# Patient Record
Sex: Male | Born: 1970 | Race: White | Hispanic: No | Marital: Single | Smoking: Current every day smoker
Health system: Southern US, Community
[De-identification: ages and names within clinical notes are randomized; demographics above are authoritative.]

## PROBLEM LIST (undated history)

## (undated) DIAGNOSIS — S2239XA Fracture of one rib, unspecified side, initial encounter for closed fracture: Secondary | ICD-10-CM

## (undated) DIAGNOSIS — S62102A Fracture of unspecified carpal bone, left wrist, initial encounter for closed fracture: Secondary | ICD-10-CM

## (undated) DIAGNOSIS — K219 Gastro-esophageal reflux disease without esophagitis: Secondary | ICD-10-CM

## (undated) HISTORY — PX: TONSILLECTOMY: SUR1361

---

## 2018-11-28 ENCOUNTER — Emergency Department
Admission: EM | Admit: 2018-11-28 | Discharge: 2018-11-28 | Disposition: A | Discharge status: Home / Self Care | Payer: No Typology Code available for payment source | Attending: Emergency Medicine | Admitting: Emergency Medicine

## 2018-11-28 ENCOUNTER — Encounter: Payer: Self-pay | Admitting: Emergency Medicine

## 2018-11-28 ENCOUNTER — Other Ambulatory Visit: Payer: Self-pay

## 2018-11-28 DIAGNOSIS — H109 Unspecified conjunctivitis: Secondary | ICD-10-CM | POA: Diagnosis not present

## 2018-11-28 DIAGNOSIS — H1033 Unspecified acute conjunctivitis, bilateral: Secondary | ICD-10-CM

## 2018-11-28 DIAGNOSIS — H10022 Other mucopurulent conjunctivitis, left eye: Secondary | ICD-10-CM | POA: Diagnosis present

## 2018-11-28 MED ORDER — POLYMYXIN B-TRIMETHOPRIM 10000-0.1 UNIT/ML-% OP SOLN: 2.0000 [drp] | Freq: Four times a day (QID) | OPHTHALMIC | 0 refills | Status: AC

## 2018-11-28 MED ORDER — POLYMYXIN B-TRIMETHOPRIM 10000-0.1 UNIT/ML-% OP SOLN
2.0000 [drp] | Freq: Four times a day (QID) | OPHTHALMIC | Status: DC
  Administered 2018-11-28: 2 [drp] via OPHTHALMIC
  Filled 2018-11-28: qty 10

## 2018-11-28 NOTE — ED Provider Notes (Signed)
[facility] Emergency Department Provider Note  ____________________________________________  Time seen: Approximately 8:55 PM  I have reviewed the triage vital signs and the nursing notes.   HISTORY  Chief Complaint Eye Problem    HPI Maurice Mullen is a 48 y.o. male who presents the emergency department complaining of right eye irritation, drainage that is now starting to involve the left.  Patient presents stating that he believes he has pinkeye.  Symptoms have been ongoing times approximately 5 days.  Patient is having a scratchy sensation to the right eye at this time.  Patient reports that he believes he got it while swimming a week ago.  No other complaints at this time.  No vision changes.  No trauma to the eye.         History reviewed. No pertinent past medical history.  There are no active problems to display for this patient.   History reviewed. No pertinent surgical history.  Prior to Admission medications   Medication Sig Start Date End Date Taking? Authorizing Provider  trimethoprim-polymyxin b (POLYTRIM) ophthalmic solution Place 2 drops into both eyes every 6 (six) hours. 11/28/18   Cuthriell, Delorise RoyalsJonathan D, PA-C    Allergies Patient has no known allergies.  No family history on file.  Social History Social History   Tobacco Use  . Smoking status: Not on file  Substance Use Topics  . Alcohol use: Not on file  . Drug use: Not on file     Review of Systems  Constitutional: No fever/chills Eyes: No visual changes.  Positive for drainage, erythema to the right eye now involving the left ENT: No upper respiratory complaints. Cardiovascular: no chest pain. Respiratory: no cough. No SOB. Gastrointestinal: No abdominal pain.  No nausea, no vomiting.   Musculoskeletal: Negative for musculoskeletal pain. Skin: Negative for rash, abrasions, lacerations, ecchymosis. Neurological: Negative for headaches, focal weakness or  numbness. 10-point ROS otherwise negative.  ____________________________________________   PHYSICAL EXAM:  VITAL SIGNS: ED Triage Vitals  Enc Vitals Group     BP 11/28/18 1836 115/80     Pulse Rate 11/28/18 1836 81     Resp 11/28/18 1836 20     Temp 11/28/18 1836 98.8 F (37.1 C)     Temp Source 11/28/18 1836 Oral     SpO2 11/28/18 1836 96 %     Weight 11/28/18 1837 220 lb (99.8 kg)     Height 11/28/18 1837 6' (1.829 m)     Head Circumference --      Peak Flow --      Pain Score 11/28/18 1837 8     Pain Loc --      Pain Edu? --      Excl. in GC? --      Constitutional: Alert and oriented. Well appearing and in no acute distress. Eyes: Conjunctivae are erythematous on right, less on the left, purulent drainage noted to the right lashes.Marland Kitchen. PERRL. EOMI. funduscopic exam reveals red reflex bilaterally.  Vasculature and optic disc is unremarkable bilaterally. Head: Atraumatic. ENT:      Ears:       Nose: No congestion/rhinnorhea.      Mouth/Throat: Mucous membranes are moist.  Neck: No stridor.    Cardiovascular: Normal rate, regular rhythm. Normal S1 and S2.  Good peripheral circulation. Respiratory: Normal respiratory effort without tachypnea or retractions. Lungs CTAB. Good air entry to the bases with no decreased or absent breath sounds. Musculoskeletal: Full range of motion to all extremities. No gross  deformities appreciated. Neurologic:  Normal speech and language. No gross focal neurologic deficits are appreciated.  Skin:  Skin is warm, dry and intact. No rash noted. Psychiatric: Mood and affect are normal. Speech and behavior are normal. Patient exhibits appropriate insight and judgement.   ____________________________________________   LABS (all labs ordered are listed, but only abnormal results are displayed)  Labs Reviewed - No data to  display ____________________________________________  EKG   ____________________________________________  RADIOLOGY   No results found.  ____________________________________________    PROCEDURES  Procedure(s) performed:    Procedures    Medications  trimethoprim-polymyxin b (POLYTRIM) ophthalmic solution 2 drop (has no administration in time range)     ____________________________________________   INITIAL IMPRESSION / ASSESSMENT AND PLAN / ED COURSE  Pertinent labs & imaging results that were available during my care of the patient were reviewed by me and considered in my medical decision making (see chart for details).  Review of the [place] CSRS was performed in accordance of the [place] prior to dispensing any controlled drugs.           Patient's diagnosis is consistent with pinkeye.  Patient presented to emergency department complaining of right eye irritation and drainage, now involving the left.  Findings are consistent with bacterial conjunctivitis.  Patient is started on Polytrim in the emergency department and will be discharged with same.  Follow-up primary care as needed..  Patient is given ED precautions to return to the ED for any worsening or new symptoms.     ____________________________________________  FINAL CLINICAL IMPRESSION(S) / ED DIAGNOSES  Final diagnoses:  Acute bacterial conjunctivitis of both eyes      NEW MEDICATIONS STARTED DURING THIS VISIT:  ED Discharge Orders         Ordered    trimethoprim-polymyxin b (POLYTRIM) ophthalmic solution  Every 6 hours     11/28/18 2059              This chart was dictated using voice recognition software/Dragon. Despite best efforts to proofread, errors can occur which can change the meaning. Any change was purely unintentional.    Darletta Moll, PA-C 11/28/18 2059    Vanessa Manati, MD 11/29/18 1326

## 2018-11-28 NOTE — ED Triage Notes (Signed)
R eye irritation x 5 days. Sclera reddened, eye watered.

## 2020-06-05 ENCOUNTER — Ambulatory Visit (INDEPENDENT_AMBULATORY_CARE_PROVIDER_SITE_OTHER): Payer: No Typology Code available for payment source | Admitting: Psychology

## 2020-06-05 DIAGNOSIS — F3289 Other specified depressive episodes: Secondary | ICD-10-CM

## 2020-06-19 ENCOUNTER — Ambulatory Visit (INDEPENDENT_AMBULATORY_CARE_PROVIDER_SITE_OTHER): Payer: No Typology Code available for payment source | Admitting: Psychology

## 2020-06-19 DIAGNOSIS — F3289 Other specified depressive episodes: Secondary | ICD-10-CM

## 2020-06-27 ENCOUNTER — Ambulatory Visit (INDEPENDENT_AMBULATORY_CARE_PROVIDER_SITE_OTHER): Payer: No Typology Code available for payment source | Admitting: Psychology

## 2020-06-27 DIAGNOSIS — F3289 Other specified depressive episodes: Secondary | ICD-10-CM | POA: Diagnosis not present

## 2020-07-03 ENCOUNTER — Ambulatory Visit (INDEPENDENT_AMBULATORY_CARE_PROVIDER_SITE_OTHER): Payer: No Typology Code available for payment source | Admitting: Psychology

## 2020-07-03 DIAGNOSIS — F3289 Other specified depressive episodes: Secondary | ICD-10-CM | POA: Diagnosis not present

## 2020-07-10 ENCOUNTER — Ambulatory Visit: Payer: Self-pay | Admitting: Psychology

## 2020-08-15 ENCOUNTER — Other Ambulatory Visit: Payer: Self-pay

## 2020-08-15 ENCOUNTER — Emergency Department (HOSPITAL_COMMUNITY)
Admission: EM | Admit: 2020-08-15 | Discharge: 2020-08-15 | Disposition: A | Discharge status: Home / Self Care | Payer: No Typology Code available for payment source | Attending: Emergency Medicine | Admitting: Emergency Medicine

## 2020-08-15 ENCOUNTER — Emergency Department (HOSPITAL_COMMUNITY): Payer: No Typology Code available for payment source

## 2020-08-15 DIAGNOSIS — S2242XA Multiple fractures of ribs, left side, initial encounter for closed fracture: Secondary | ICD-10-CM

## 2020-08-15 DIAGNOSIS — S52592A Other fractures of lower end of left radius, initial encounter for closed fracture: Secondary | ICD-10-CM

## 2020-08-15 DIAGNOSIS — S0990XA Unspecified injury of head, initial encounter: Secondary | ICD-10-CM

## 2020-08-15 DIAGNOSIS — W19XXXA Unspecified fall, initial encounter: Secondary | ICD-10-CM

## 2020-08-15 DIAGNOSIS — S0001XA Abrasion of scalp, initial encounter: Secondary | ICD-10-CM | POA: Insufficient documentation

## 2020-08-15 DIAGNOSIS — Z23 Encounter for immunization: Secondary | ICD-10-CM | POA: Insufficient documentation

## 2020-08-15 DIAGNOSIS — S6992XA Unspecified injury of left wrist, hand and finger(s), initial encounter: Secondary | ICD-10-CM | POA: Diagnosis present

## 2020-08-15 DIAGNOSIS — R0789 Other chest pain: Secondary | ICD-10-CM | POA: Insufficient documentation

## 2020-08-15 DIAGNOSIS — S52502A Unspecified fracture of the lower end of left radius, initial encounter for closed fracture: Secondary | ICD-10-CM | POA: Diagnosis not present

## 2020-08-15 DIAGNOSIS — W11XXXA Fall on and from ladder, initial encounter: Secondary | ICD-10-CM | POA: Insufficient documentation

## 2020-08-15 LAB — COMPREHENSIVE METABOLIC PANEL
ALT: 23 U/L (ref 0–44)
AST: 37 U/L (ref 15–41)
Albumin: 3.4 g/dL — ABNORMAL LOW (ref 3.5–5.0)
Alkaline Phosphatase: 49 U/L (ref 38–126)
Anion gap: 4 — ABNORMAL LOW (ref 5–15)
BUN: 11 mg/dL (ref 6–20)
CO2: 27 mmol/L (ref 22–32)
Calcium: 8.6 mg/dL — ABNORMAL LOW (ref 8.9–10.3)
Chloride: 106 mmol/L (ref 98–111)
Creatinine, Ser: 0.98 mg/dL (ref 0.61–1.24)
GFR, Estimated: >60 mL/min (ref >60)
Glucose, Bld: 122 mg/dL — ABNORMAL HIGH (ref 70–99)
Potassium: 5.1 mmol/L (ref 3.5–5.1)
Sodium: 137 mmol/L (ref 135–145)
Total Bilirubin: 1.2 mg/dL (ref 0.3–1.2)
Total Protein: 6.1 g/dL — ABNORMAL LOW (ref 6.5–8.1)

## 2020-08-15 LAB — CBC
HCT: 43.1 % (ref 39.0–52.0)
Hemoglobin: 14.2 g/dL (ref 13.0–17.0)
MCH: 30.9 pg (ref 26.0–34.0)
MCHC: 32.9 g/dL (ref 30.0–36.0)
MCV: 93.9 fL (ref 80.0–100.0)
Platelets: 272 10*3/uL (ref 150–400)
RBC: 4.59 MIL/uL (ref 4.22–5.81)
RDW: 11.9 % (ref 11.5–15.5)
WBC: 8.4 10*3/uL (ref 4.0–10.5)
nRBC: 0 % (ref 0.0–0.2)

## 2020-08-15 IMAGING — CR DG FOREARM 2V*L*
2 series · 2 of 2 positions shown · non-contrast
Comparison: None.

CLINICAL DATA: Pain after fall from 7 foot ladder

EXAM:
LEFT FOREARM - 2 VIEW

[forearm ap]
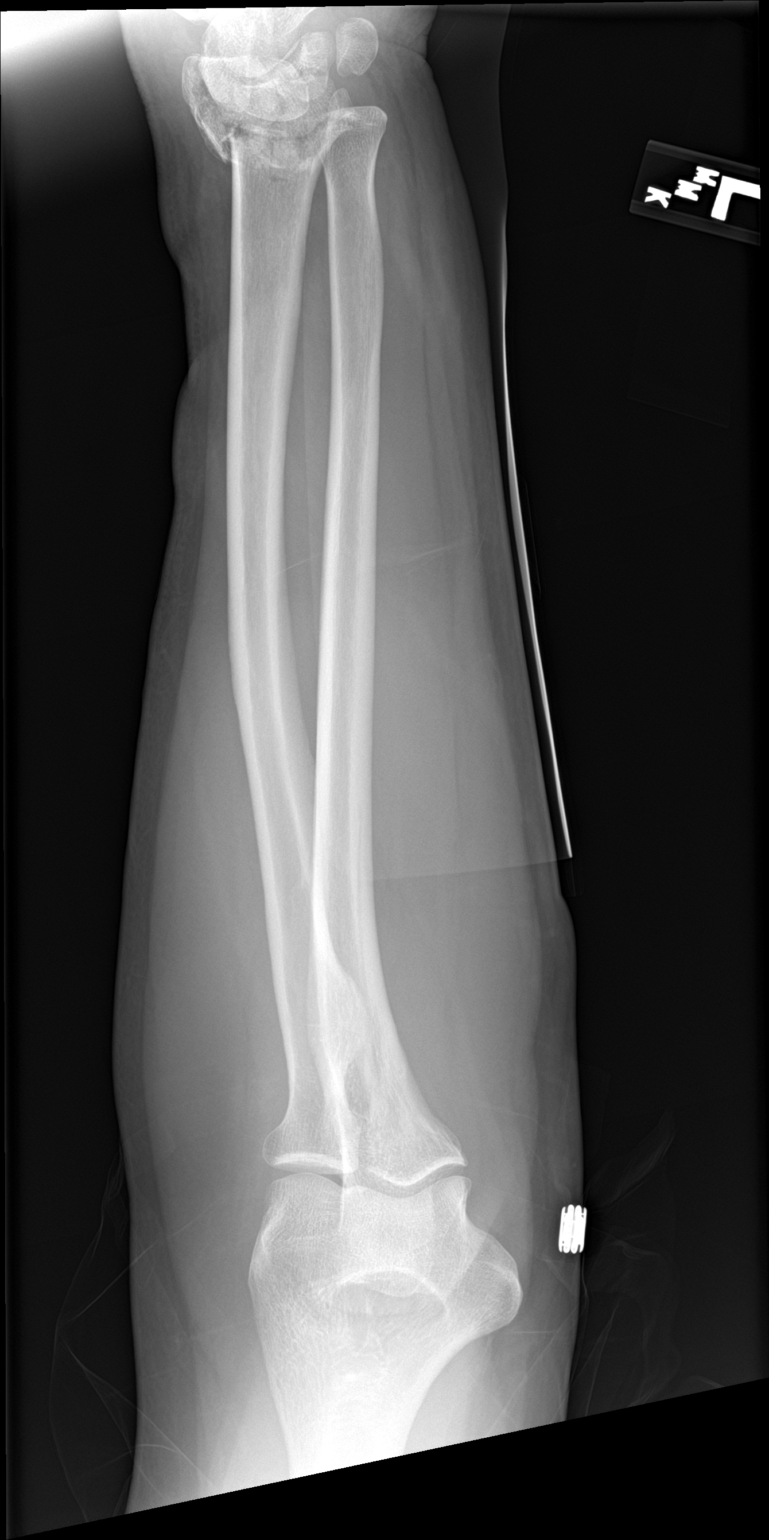

[forearm lat]
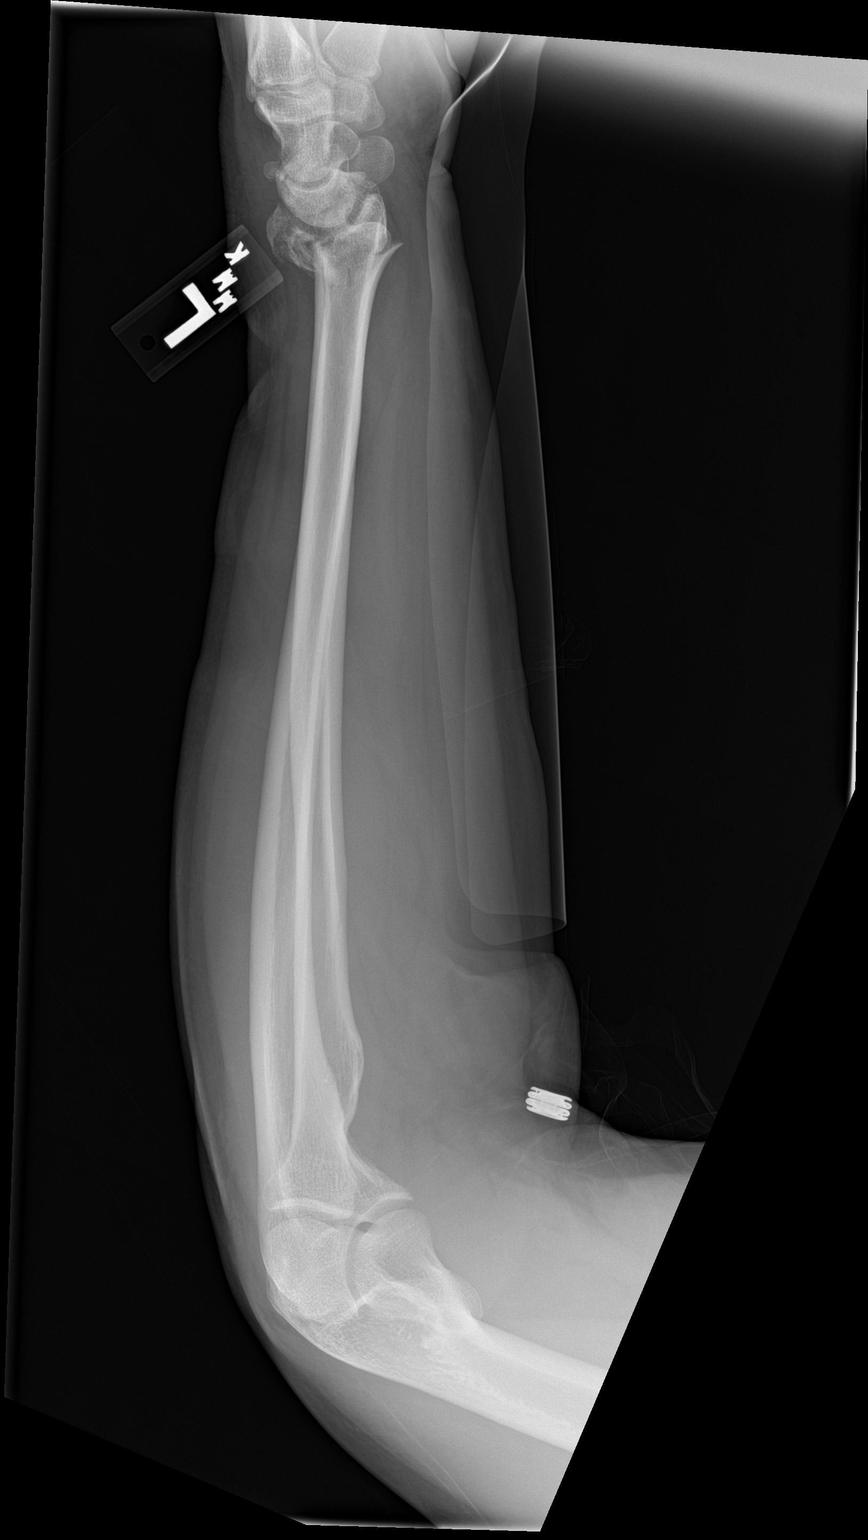

[2 of 2 positions shown; findings below may reference images not displayed]

FINDINGS: Comminuted fracture of the distal radius with dorsal angulation and
displacement of the dominant fracture fragment and extension into
the radiocarpal joint with disruption of the distal radioulnar
joint. The proximal radioulnar joint appear intact. No proximal
radial or ulnar fracture visualized. Soft tissues are unremarkable.
IMPRESSION: Comminuted fracture of the distal radius with dorsal angulation and
displacement of the dominant fracture fragment and extension into
the radiocarpal joint with disruption of the distal radioulnar
joint.

## 2020-08-15 IMAGING — CR DG RIBS W/ CHEST 3+V*L*
4 series · 4 of 4 positions shown · non-contrast
Comparison: None.

CLINICAL DATA: Pain after fall from 7 foot ladder

EXAM:
LEFT RIBS AND CHEST - 3+ VIEW

[chest pa]
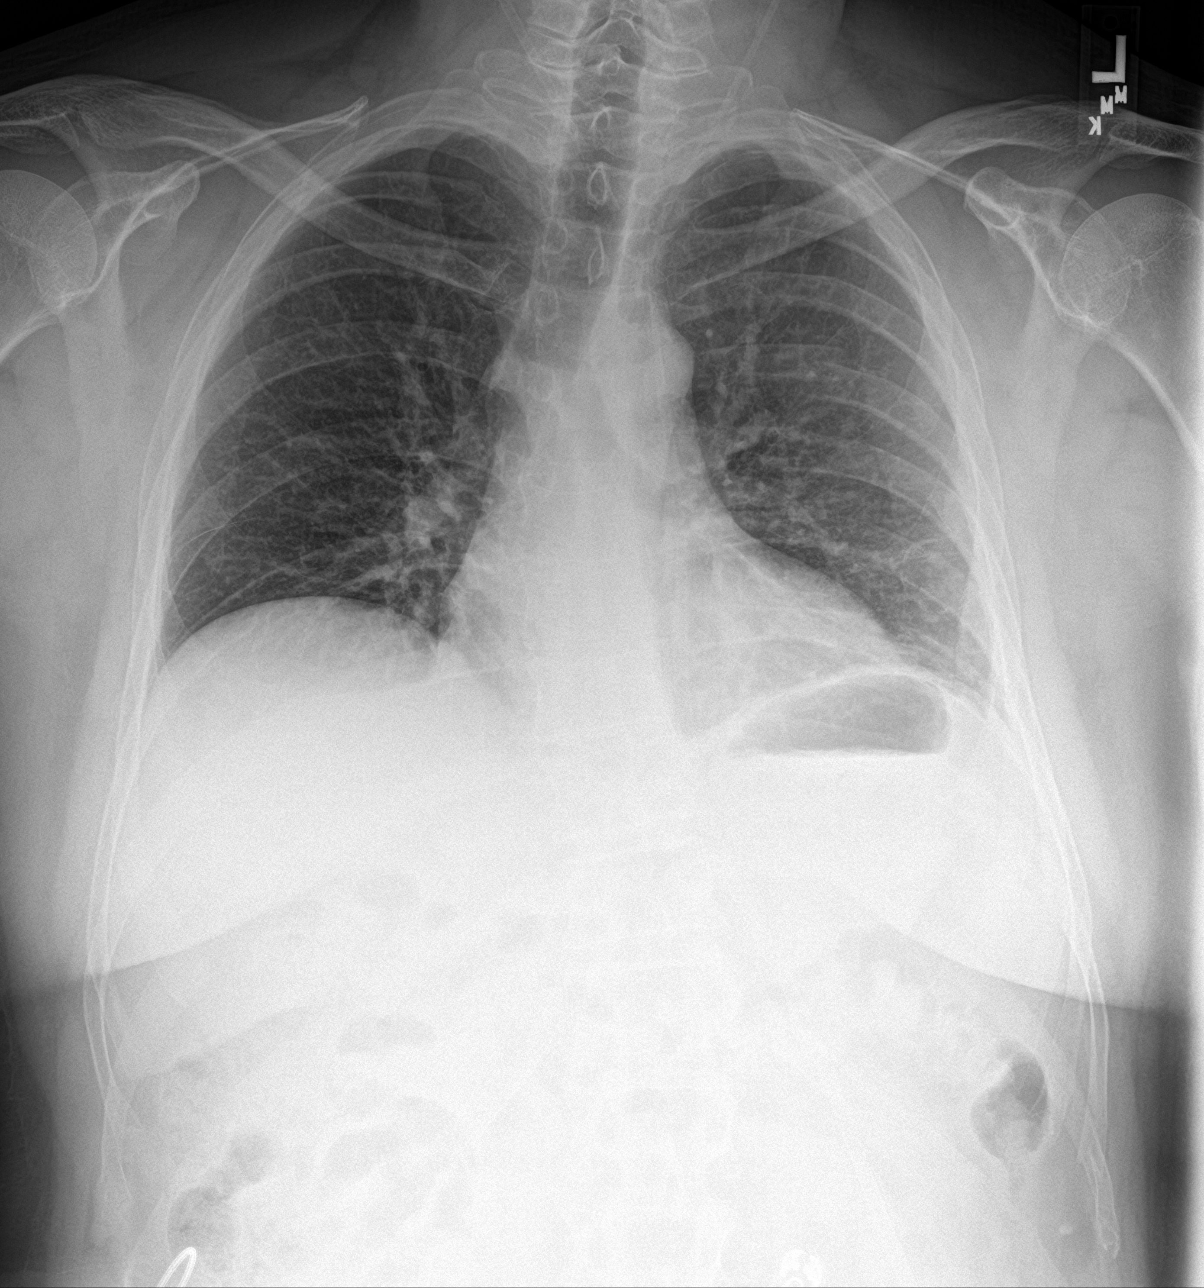

[rib ap (1 of 2)]
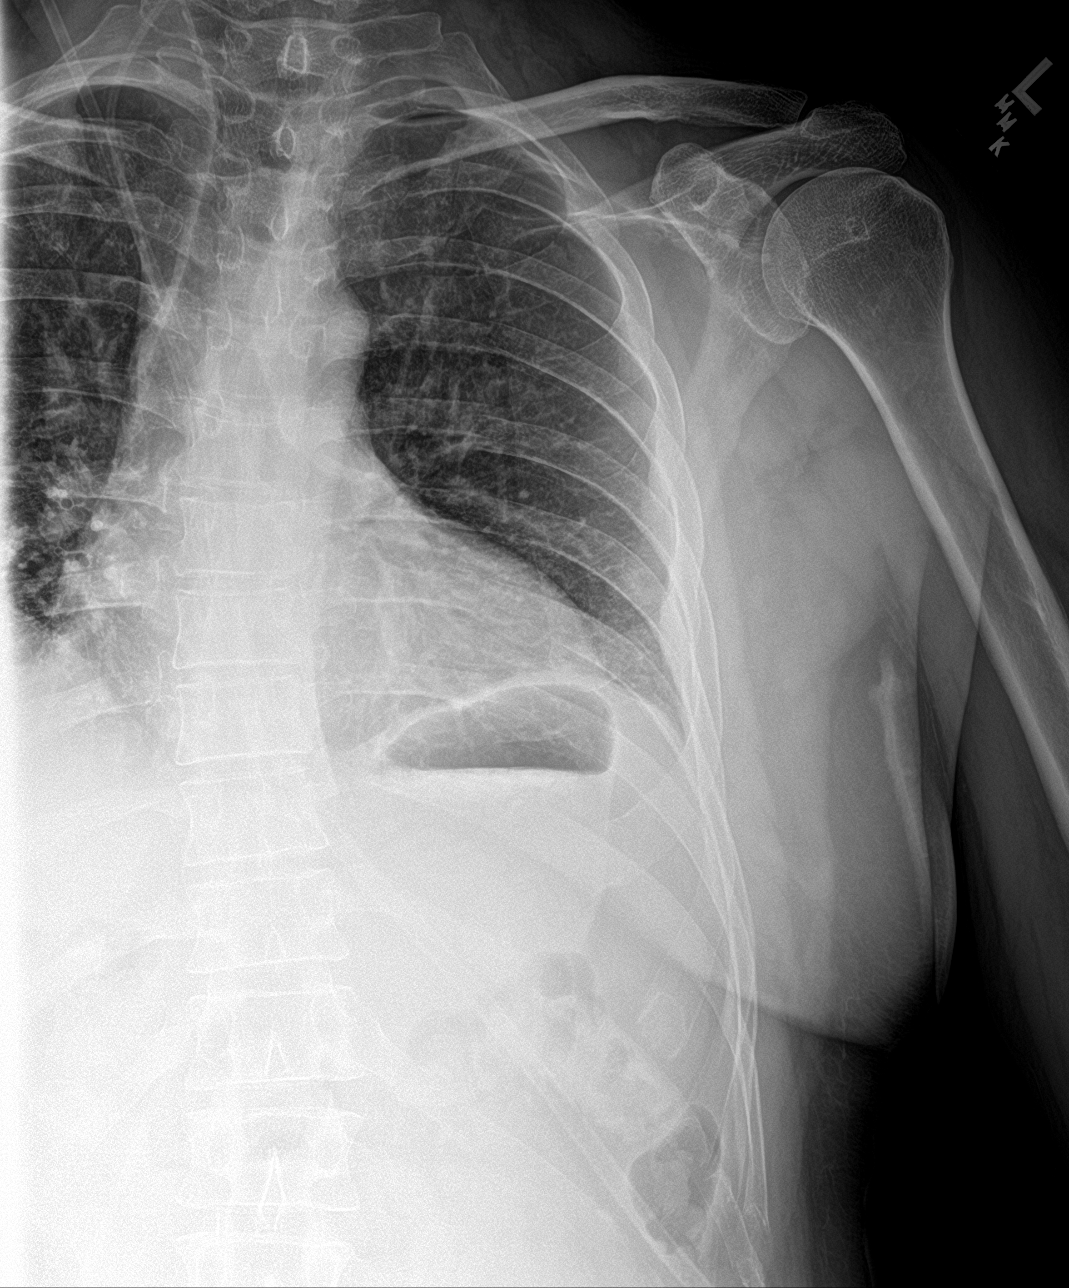

[rib ap obl]
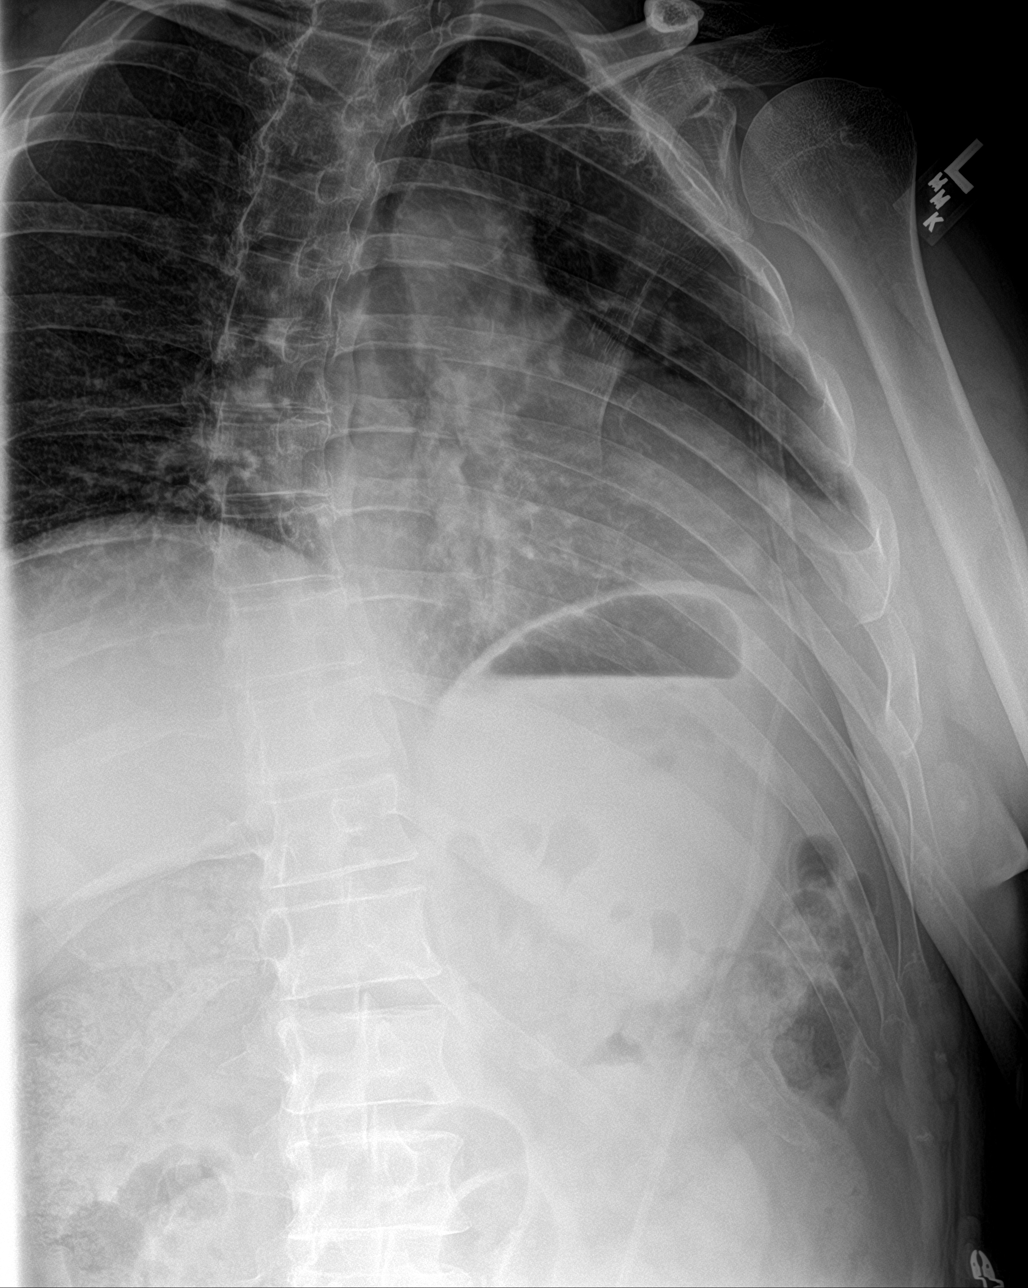

[rib ap (2 of 2)]
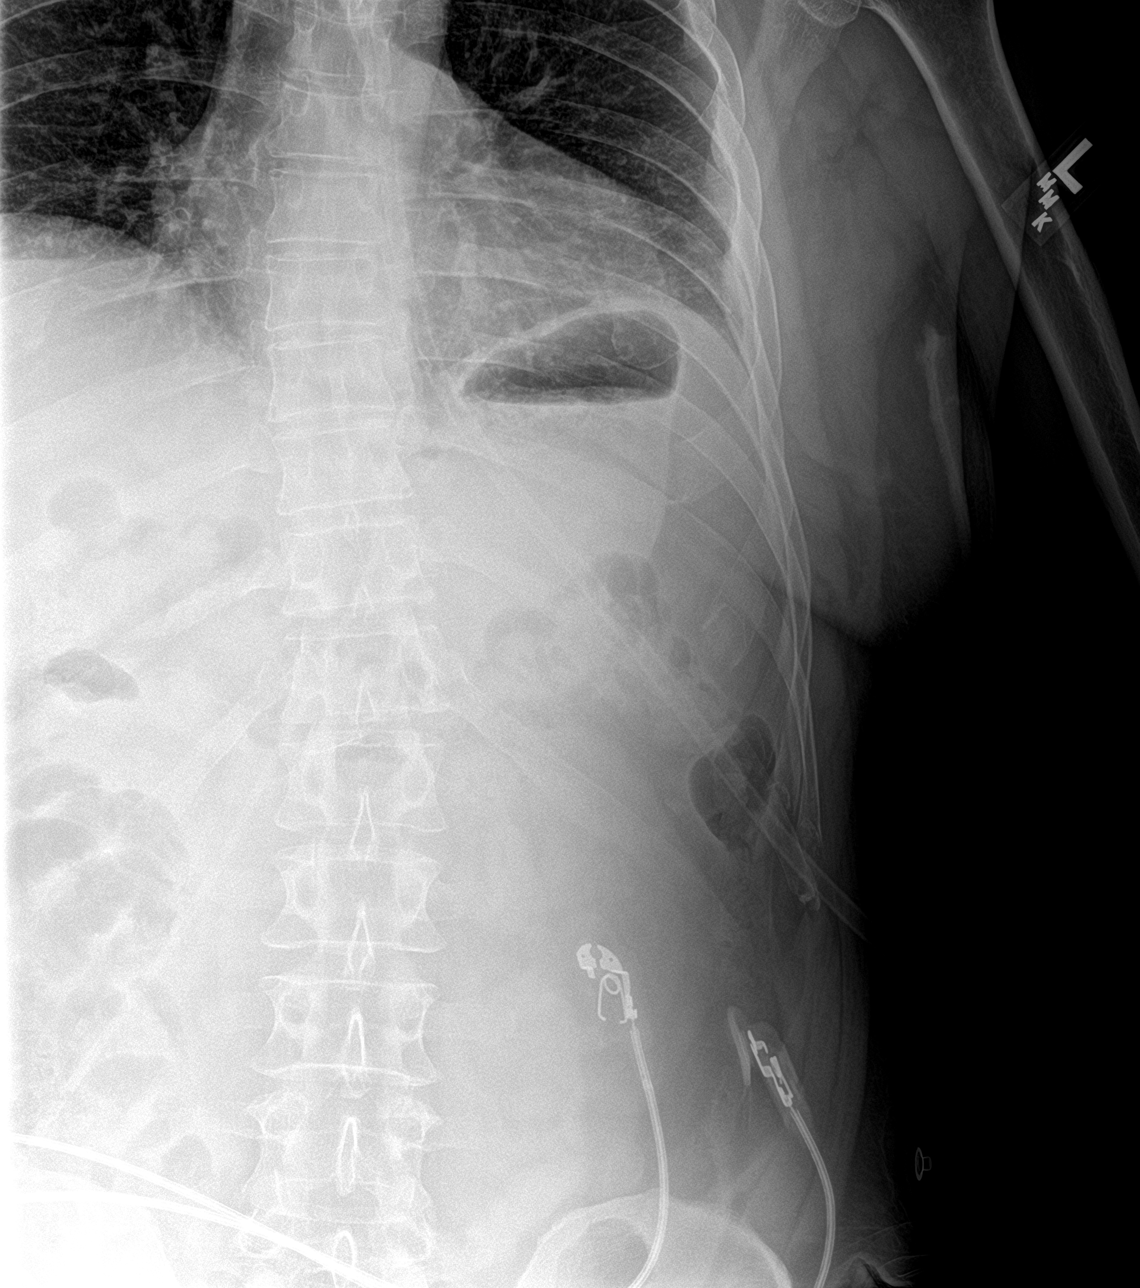

[4 of 4 positions shown; findings below may reference images not displayed]

FINDINGS: Minimally displaced 2 part fractures of the left fifth and sixth
ribs involving the posteromedial and posterolateral aspects of the
ribs as well as possible nondisplaced posterior seventh and fourth
rib fractures. There is no evidence of pneumothorax or pleural
effusion. Both lungs are clear. Heart size and mediastinal contours
are within normal limits.
IMPRESSION: Minimally displaced 2 part fractures of the left fifth and sixth
rads involving the posteromedial and posterolateral aspects of the
ribs as well as possible nondisplaced posterior seventh and fourth
rib fractures. No pneumothorax.

## 2020-08-15 IMAGING — CR DG SHOULDER 2+V*L*
4 series · 4 of 4 positions shown · non-contrast
Comparison: Same day rib radiographs.

CLINICAL DATA: Pain after fall from 7 foot ladder

EXAM:
LEFT SHOULDER - 2+ VIEW

[shoulder grashey]
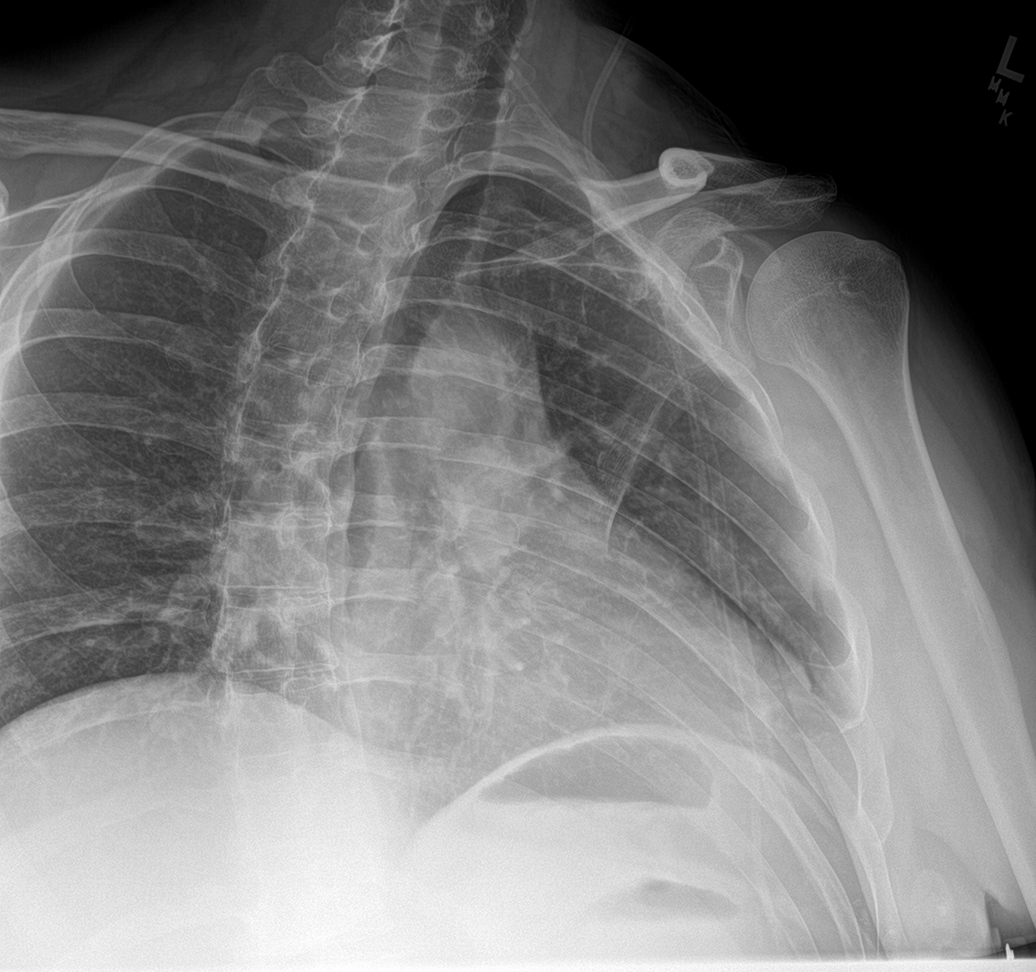

[shoulder y view (1 of 2)]
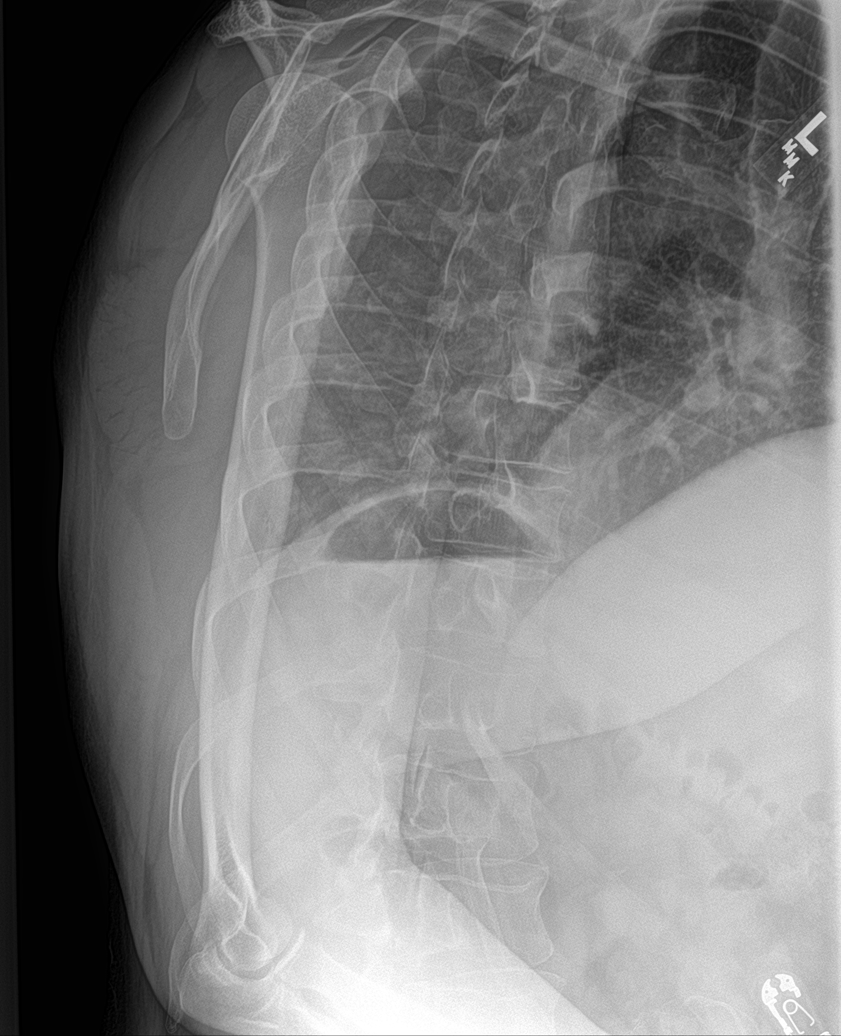

[shoulder ap neutral]
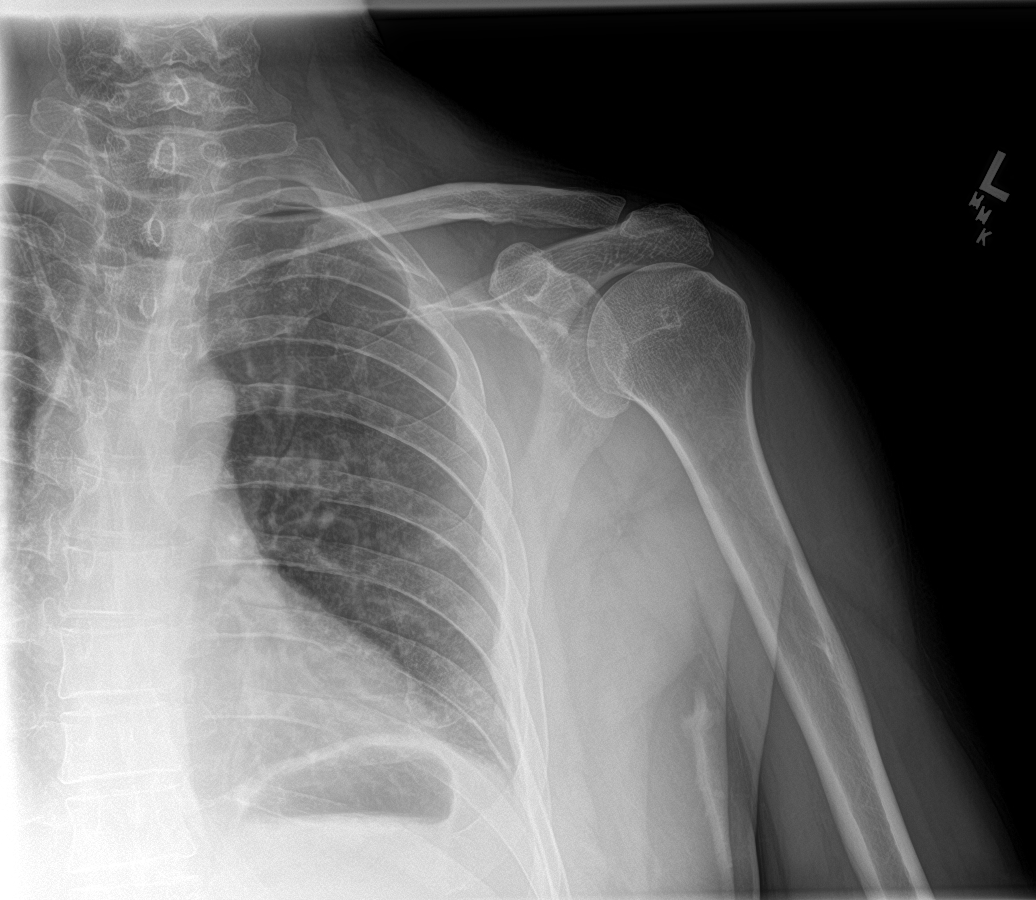

[shoulder y view (2 of 2)]
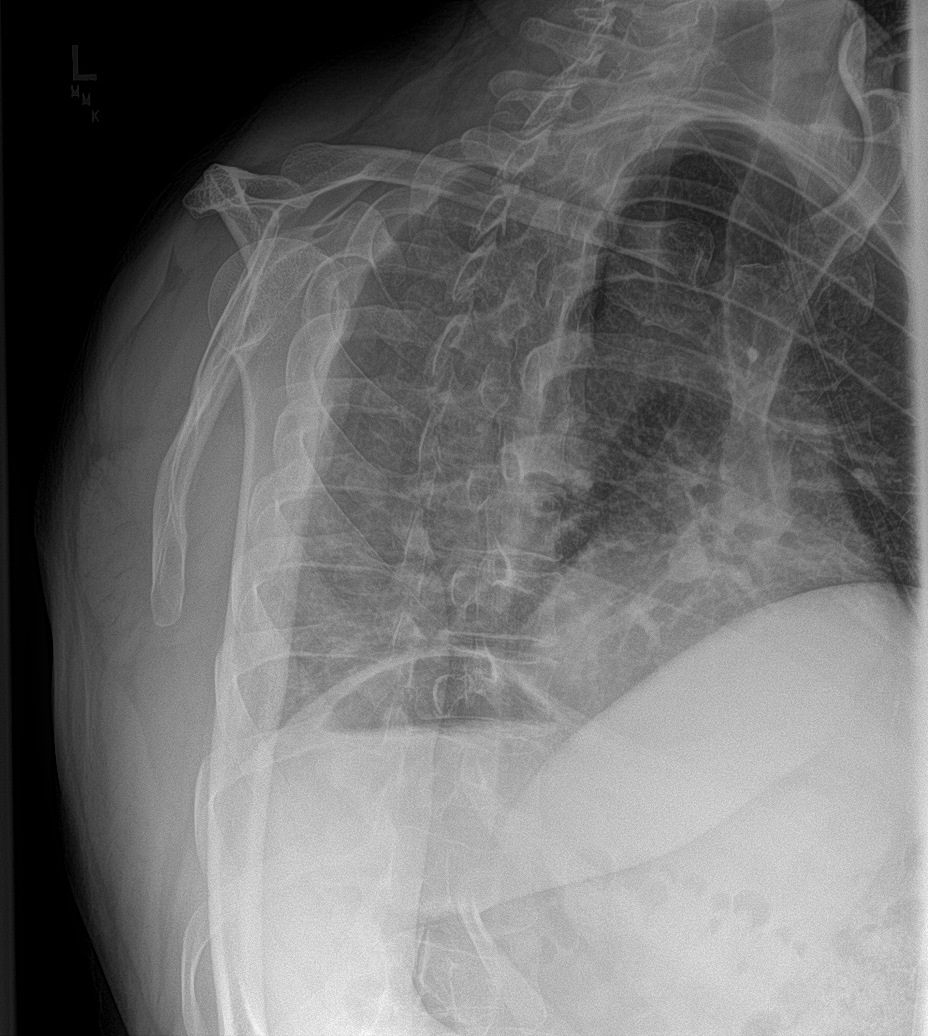

[4 of 4 positions shown; findings below may reference images not displayed]

FINDINGS: The humerus is seated within the glenoid. Normal alignment of the AC
joint. Minimally displaced left fifth and sixth rib fractures and
probable nondisplaced left seventh rib fracture. Soft tissues are
unremarkable.
IMPRESSION: Humerus is seated within the glenoid with normal AC joint alignment.

Minimally displaced left fifth and sixth rib fractures and probable
nondisplaced left seventh rib fracture, better evaluated on same day
rib radiographs.

## 2020-08-15 IMAGING — CR DG WRIST COMPLETE 3+V*L*
3 series · 3 of 3 positions shown · non-contrast
Comparison: None.

CLINICAL DATA: Pain after fall from 7 foot ladder

EXAM:
LEFT WRIST - COMPLETE 3+ VIEW

[wrist pa]
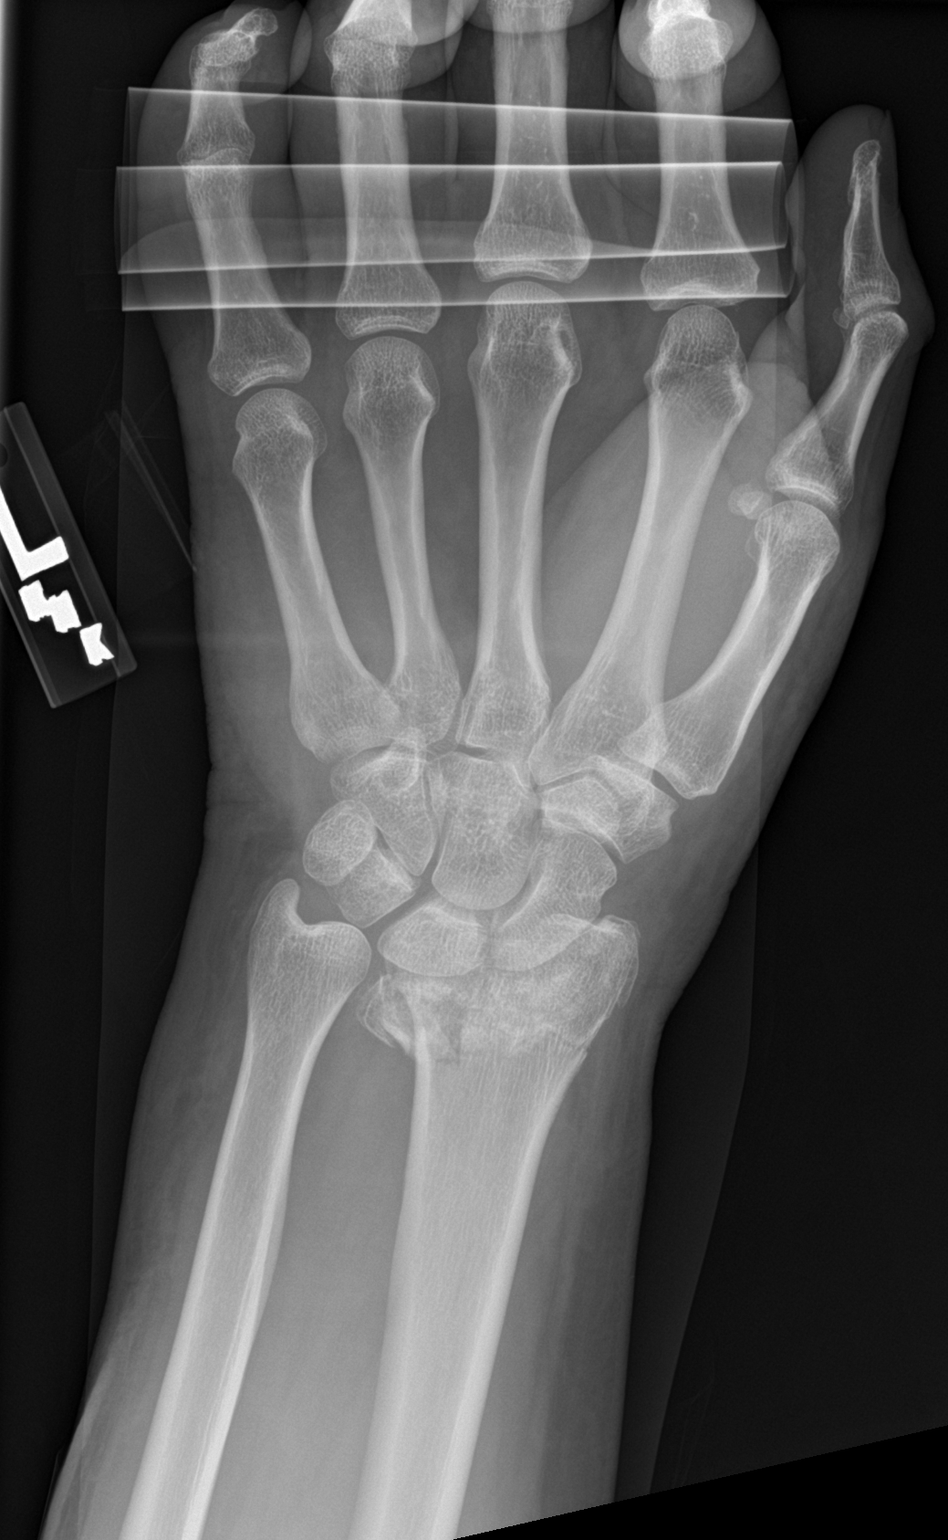

[wrist obl]
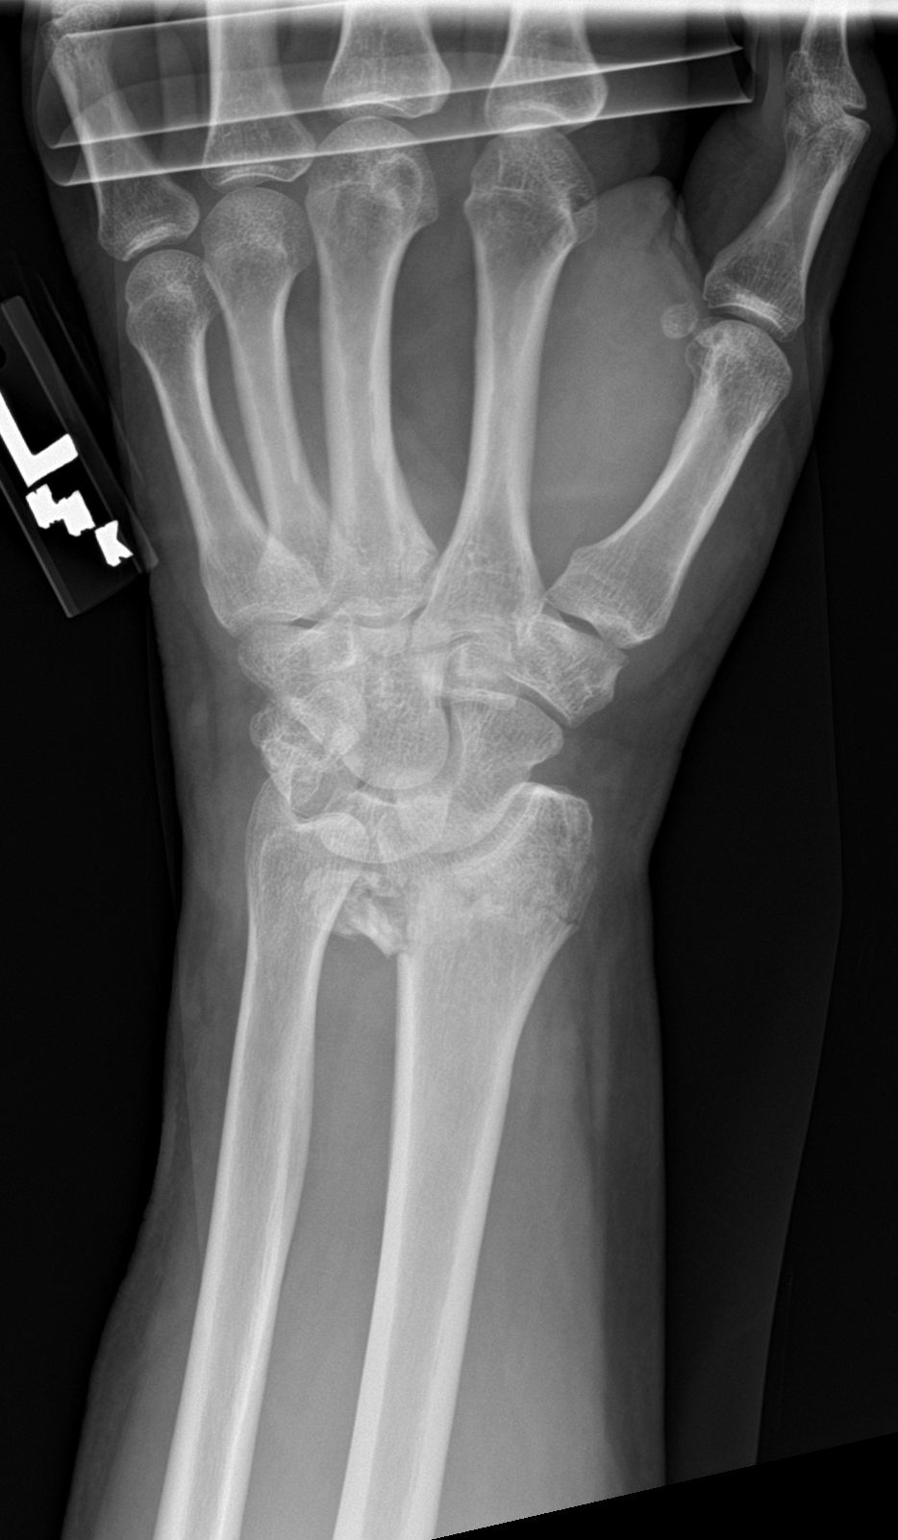

[wrist lat]
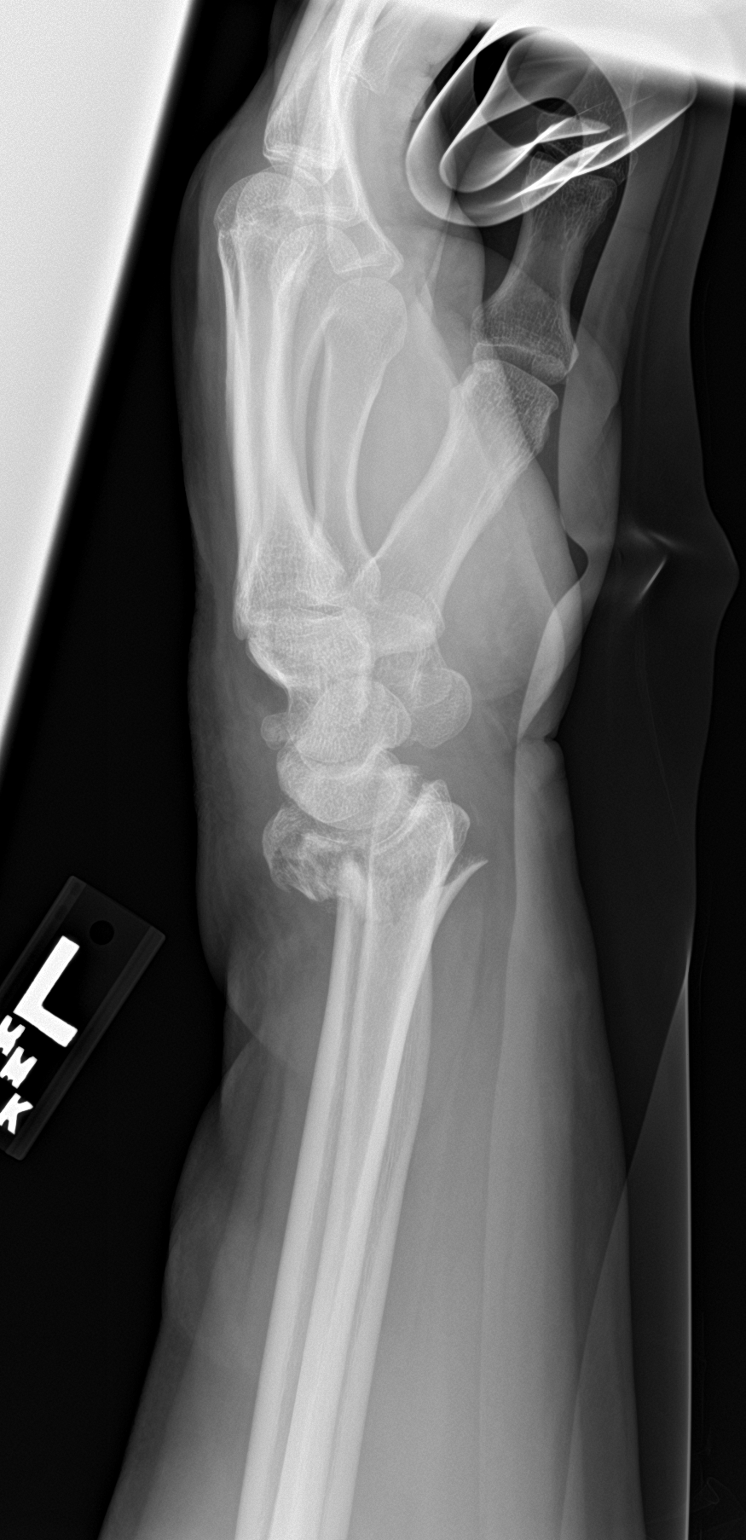

[3 of 3 positions shown; findings below may reference images not displayed]

FINDINGS: Comminuted fracture of the distal radius with dorsal angulation and
displacement of the dominant fracture fragment and extension into
the radiocarpal joint with disruption of the distal radioulnar
joint.
IMPRESSION: Comminuted fracture of the distal radius, described above.

## 2020-08-15 MED ORDER — LIDOCAINE-EPINEPHRINE (PF) 2 %-1:200000 IJ SOLN
20.0000 mL | Freq: Once | INTRAMUSCULAR | Status: AC
  Administered 2020-08-15: 20 mL via INTRADERMAL

## 2020-08-15 MED ORDER — LIDOCAINE HCL (PF) 1 % IJ SOLN
INTRAMUSCULAR | Status: AC
  Filled 2020-08-15: qty 5

## 2020-08-15 MED ORDER — ONDANSETRON HCL 4 MG/2ML IJ SOLN
4.0000 mg | INTRAMUSCULAR | Status: AC
  Administered 2020-08-15: 4 mg via INTRAVENOUS
  Filled 2020-08-15: qty 2

## 2020-08-15 MED ORDER — TETANUS-DIPHTH-ACELL PERTUSSIS 5-2.5-18.5 LF-MCG/0.5 IM SUSY
0.5000 mL | PREFILLED_SYRINGE | Freq: Once | INTRAMUSCULAR | Status: AC
  Administered 2020-08-15: 0.5 mL via INTRAMUSCULAR

## 2020-08-15 MED ORDER — HYDROMORPHONE HCL 1 MG/ML IJ SOLN
1.0000 mg | Freq: Once | INTRAMUSCULAR | Status: AC
  Administered 2020-08-15: 1 mg via INTRAVENOUS
  Filled 2020-08-15: qty 1

## 2020-08-15 MED ORDER — NAPROXEN 500 MG PO TABS: 500.0000 mg | ORAL_TABLET | Freq: Two times a day (BID) | ORAL | 0 refills | Status: AC

## 2020-08-15 MED ORDER — OXYCODONE-ACETAMINOPHEN 5-325 MG PO TABS: 1.0000 | ORAL_TABLET | Freq: Four times a day (QID) | ORAL | 0 refills | Status: AC | PRN

## 2020-08-15 MED ORDER — LIDOCAINE-EPINEPHRINE (PF) 2 %-1:200000 IJ SOLN
INTRAMUSCULAR | Status: AC
  Filled 2020-08-15: qty 20

## 2020-08-15 NOTE — Progress Notes (Addendum)
Orthopedic Tech Progress Note Patient Details:  Maurice Mullen 03-24-1971 [id number] MD held while I applied splint also patient was a level 2 trauma that was downgraded Ortho Devices Type of Ortho Device: Arm sling,Sugartong splint Ortho Device/Splint Location: LUE Ortho Device/Splint Interventions: Ordered,Application   Post Interventions Patient Tolerated: Well Instructions Provided: Care of device   Donald Pore 08/15/2020, 3:51 PM

## 2020-08-15 NOTE — ED Triage Notes (Signed)
Pt bib ems after falling from ladder onto concrete. EMS reports + deformity to L wrist, crepitus to L ribcage. Denies LOC. VSS with EMS. Given fentanyl en route.

## 2020-08-15 NOTE — Discharge Instructions (Addendum)
If you should develop increasing pain swelling or any numbness of the fingers please return to the emergency department immediately Your x-rays show that you have a fracture of your left wrist We have applied the splint in the ER - please wear this until your follow up The office of Dr. Janee Morn will call you to arrange follow up - but call in the morning tomorrow if you have not heard back. Please use the incentive spirometer (breathing machine) that we have given you, this will help minimize the pain in your chest from the rib fractures however increasing your breathing, coughing or a lot of movement will also cause pain. If you should develop increasing shortness of breath, chest pain or fever return to the ER immediately - the ribs will likely take 6-8 weeks to heal up.  You may take naproxen up to twice a day as needed for pain  If you have severe pain you may take 1 Percocet every 6 hours but be aware that this can make you nauseated and constipated.  Do not drive while taking this medication.

## 2020-08-15 NOTE — ED Notes (Signed)
Pt transported to xray 

## 2020-08-15 NOTE — ED Provider Notes (Signed)
[facility] EMERGENCY DEPARTMENT Provider Note   CSN: [id number] Arrival date & time: 08/15/20  1152     History Chief Complaint  Patient presents with  . Fall    Araceli Coufal is a 50 y.o. male.  HPI   Patient is a 50 year old male presents by EMS after falling off of a ladder from approximately 6 or 7 feet this was an A-frame ladder that went the other direction, he fell directly onto his left arm trying to catch himself with his hand and sustained an injury to his left distal forearm then fell with his left elbow into his ribs causing pain in the ribs.  There is a minimal head bump but no loss of consciousness, no neck pain, no numbness or weakness, no injury to the legs, denies any chest pain or abdominal pain.  Paramedics reported crepitance to the left side of the chest and activated as a level 2 trauma.  He was given 175 mcg of fentanyl prehospital which she states has helped.  He is not up-to-date on tetanus.  No past medical history on file.  There are no problems to display for this patient.  No Significant PMH other than tobacco use   Family History :  MI / CAD    Home Medications Prior to Admission medications   Medication Sig Start Date End Date Taking? Authorizing Provider  naproxen (NAPROSYN) 500 MG tablet Take 1 tablet (500 mg total) by mouth 2 (two) times daily with a meal. 08/15/20  Yes Eber Hong, MD  oxyCODONE-acetaminophen (PERCOCET/ROXICET) 5-325 MG tablet Take 1 tablet by mouth every 6 (six) hours as needed for severe pain. 08/15/20  Yes Eber Hong, MD    Allergies    Patient has no allergy information on record.  Review of Systems   Review of Systems  All other systems reviewed and are negative.   Physical Exam Updated Vital Signs BP 123/74   Pulse 75   Temp (!) 96.8 F (36 C) (Oral)   Resp 15   Ht 1.905 m (6\' 3" )   Wt 104.3 kg   SpO2 99%   BMI 28.74 kg/m   Physical Exam Vitals and nursing note reviewed.   Constitutional:      General: He is not in acute distress.    Appearance: He is well-developed.  HENT:     Head: Normocephalic.     Comments: Tiny abrasion to the left scalp just over the temporal scalp in the hairline, no bleeding, no hematoma, no laceration    Mouth/Throat:     Pharynx: No oropharyngeal exudate.  Eyes:     General: No scleral icterus.       Right eye: No discharge.        Left eye: No discharge.     Conjunctiva/sclera: Conjunctivae normal.     Pupils: Pupils are equal, round, and reactive to light.  Neck:     Thyroid: No thyromegaly.     Vascular: No JVD.  Cardiovascular:     Rate and Rhythm: Normal rate and regular rhythm.     Heart sounds: Normal heart sounds. No murmur heard. No friction rub. No gallop.   Pulmonary:     Effort: Pulmonary effort is normal. No respiratory distress.     Breath sounds: Normal breath sounds. No wheezing or rales.     Comments: Mild tenderness over the chest wall left anterior lateral and posterior, no crepitance or subcutaneous emphysema.  There is tenderness over the left scapula  Chest:     Chest wall: Tenderness present.  Abdominal:     General: Bowel sounds are normal. There is no distension.     Palpations: Abdomen is soft. There is no mass.     Tenderness: There is no abdominal tenderness.  Musculoskeletal:        General: Tenderness present. Normal range of motion.     Cervical back: Normal range of motion and neck supple.     Comments: Tenderness and deformity to the left distal forearm, normal pulses and sensation and grip distal to that.  Elbow and shoulder are normal on that side  Lymphadenopathy:     Cervical: No cervical adenopathy.  Skin:    General: Skin is warm and dry.     Findings: No erythema or rash.     Comments: Scattered abrasions, no lacerations  Neurological:     Mental Status: He is alert.     Coordination: Coordination normal.     Comments: Able to move all 4 extremities, speech is clear,  cranial nerves III through XII are normal  Psychiatric:        Behavior: Behavior normal.     ED Results / Procedures / Treatments   Labs (all labs ordered are listed, but only abnormal results are displayed) Labs Reviewed  COMPREHENSIVE METABOLIC PANEL - Abnormal; Notable for the following components:      Result Value   Glucose, Bld 122 (*)    Calcium 8.6 (*)    Total Protein 6.1 (*)    Albumin 3.4 (*)    Anion gap 4 (*)    All other components within normal limits  CBC    EKG EKG Interpretation  Date/Time:  Tuesday August 15 2020 12:00:40 EDT Ventricular Rate:  60 PR Interval:  177 QRS Duration: 96 QT Interval:  424 QTC Calculation: 424 R Axis:   -34 Text Interpretation: Sinus rhythm Left axis deviation RSR' in V1 or V2, probably normal variant Confirmed by Eber HongMiller, Monifa Blanchette ([id number]) on 08/15/2020 1:15:04 PM   Radiology DG Ribs Unilateral W/Chest Left  Result Date: 08/15/2020 CLINICAL DATA:  Pain after fall from 7 foot ladder EXAM: LEFT RIBS AND CHEST - 3+ VIEW COMPARISON:  None. FINDINGS: Minimally displaced 2 part fractures of the left fifth and sixth ribs involving the posteromedial and posterolateral aspects of the ribs as well as possible nondisplaced posterior seventh and fourth rib fractures. There is no evidence of pneumothorax or pleural effusion. Both lungs are clear. Heart size and mediastinal contours are within normal limits. IMPRESSION: Minimally displaced 2 part fractures of the left fifth and sixth rads involving the posteromedial and posterolateral aspects of the ribs as well as possible nondisplaced posterior seventh and fourth rib fractures. No pneumothorax. Electronically Signed   By: Maudry MayhewJeffrey  Waltz MD   On: 08/15/2020 14:13   DG Forearm Left  Result Date: 08/15/2020 CLINICAL DATA:  Pain after fall from 7 foot ladder EXAM: LEFT FOREARM - 2 VIEW COMPARISON:  None. FINDINGS: Comminuted fracture of the distal radius with dorsal angulation and displacement of the  dominant fracture fragment and extension into the radiocarpal joint with disruption of the distal radioulnar joint. The proximal radioulnar joint appear intact. No proximal radial or ulnar fracture visualized. Soft tissues are unremarkable. IMPRESSION: Comminuted fracture of the distal radius with dorsal angulation and displacement of the dominant fracture fragment and extension into the radiocarpal joint with disruption of the distal radioulnar joint. Electronically Signed   By: Maudry MayhewJeffrey  Waltz MD  On: 08/15/2020 14:19   DG Wrist Complete Left  Result Date: 08/15/2020 CLINICAL DATA:  Pain after fall from 7 foot ladder EXAM: LEFT WRIST - COMPLETE 3+ VIEW COMPARISON:  None. FINDINGS: Comminuted fracture of the distal radius with dorsal angulation and displacement of the dominant fracture fragment and extension into the radiocarpal joint with disruption of the distal radioulnar joint. IMPRESSION: Comminuted fracture of the distal radius, described above. Electronically Signed   By: Maudry Mayhew MD   On: 08/15/2020 14:10   DG Shoulder Left  Result Date: 08/15/2020 CLINICAL DATA:  Pain after fall from 7 foot ladder EXAM: LEFT SHOULDER - 2+ VIEW COMPARISON:  Same day rib radiographs. FINDINGS: The humerus is seated within the glenoid. Normal alignment of the AC joint. Minimally displaced left fifth and sixth rib fractures and probable nondisplaced left seventh rib fracture. Soft tissues are unremarkable. IMPRESSION: Humerus is seated within the glenoid with normal AC joint alignment. Minimally displaced left fifth and sixth rib fractures and probable nondisplaced left seventh rib fracture, better evaluated on same day rib radiographs. Electronically Signed   By: Maudry Mayhew MD   On: 08/15/2020 14:18    Procedures Reduction of fracture  Date/Time: 08/15/2020 3:46 PM Performed by: Eber Hong, MD Authorized by: Eber Hong, MD  Consent: Verbal consent obtained. Risks and benefits: risks, benefits and  alternatives were discussed Consent given by: patient Patient understanding: patient states understanding of the procedure being performed Patient consent: the patient's understanding of the procedure matches consent given Procedure consent: procedure consent matches procedure scheduled Relevant documents: relevant documents present and verified Test results: test results available and properly labeled Site marked: the operative site was marked Imaging studies: imaging studies available Required items: required blood products, implants, devices, and special equipment available Patient identity confirmed: verbally with patient and arm band Time out: Immediately prior to procedure a "time out" was called to verify the correct patient, procedure, equipment, support staff and site/side marked as required. Preparation: Patient was prepped and draped in the usual sterile fashion. Local anesthesia used: yes Anesthesia: hematoma block  Anesthesia: Local anesthesia used: yes Local Anesthetic: lidocaine 1% with epinephrine Anesthetic total: 10 mL  Sedation: Patient sedated: no  Patient tolerance: patient tolerated the procedure well with no immediate complications Comments: With traction and manual reduction the fracture was reduced and there was better bony alignment clinically.   Marland KitchenSplint Application  Date/Time: 08/15/2020 3:48 PM Performed by: Eber Hong, MD Authorized by: Eber Hong, MD   Consent:    Consent obtained:  Verbal   Consent given by:  Patient   Risks, benefits, and alternatives were discussed: yes     Risks discussed:  Discoloration, numbness, pain and swelling Universal protocol:    Procedure explained and questions answered to patient or proxy's satisfaction: yes     Relevant documents present and verified: yes     Test results available: yes     Imaging studies available: yes     Required blood products, implants, devices, and special equipment available: yes      Site/side marked: yes     Immediately prior to procedure a time out was called: yes     Patient identity confirmed:  Verbally with patient Pre-procedure details:    Distal neurologic exam:  Normal   Distal perfusion: distal pulses strong and brisk capillary refill   Procedure details:    Location:  Wrist   Wrist location:  L wrist   Splint type:  Sugar tong  Supplies:  Elastic bandage, plaster, sling and cotton padding   Attestation: Splint applied and adjusted personally by me (with assistance from orthopedic tech)   Post-procedure details:    Distal neurologic exam:  Normal   Distal perfusion: distal pulses strong, brisk capillary refill and unchanged     Procedure completion:  Tolerated well, no immediate complications Comments:           Medications Ordered in ED Medications  Tdap (BOOSTRIX) injection 0.5 mL (0.5 mLs Intramuscular Given 08/15/20 1210)  lidocaine-EPINEPHrine (XYLOCAINE W/EPI) 2 %-1:200000 (PF) injection 20 mL ( Intradermal Not Given 08/15/20 1241)  ondansetron (ZOFRAN) injection 4 mg (4 mg Intravenous Given 08/15/20 1248)  HYDROmorphone (DILAUDID) injection 1 mg (1 mg Intravenous Given 08/15/20 1524)    ED Course  I have reviewed the triage vital signs and the nursing notes.  Pertinent labs & imaging results that were available during my care of the patient were reviewed by me and considered in my medical decision making (see chart for details).    MDM Rules/Calculators/A&P                          Patient arrives as a level 2 trauma, this was downgraded on arrival, I suspect that there is what appears to be fractures released to the forearm if not more.  Patient is agreeable to treatment with x-rays and immobilization and fracture reduction as needed.  With orthopedics, they request that we set the fracture, immobilized with a sugar tong and have follow-up with Dr. Janee Morn, according to Mr. Lin Givens the office will call the patient for follow-up, he will be  given pain medication for home, incentive spirometer, he is otherwise well-appearing, he has a family member with him to drive.  No pneumothorax, several rib fractures, no significant displacement or significant underlying lung injury, the patient is neurovascularly intact is maintained a normal level of alertness and after identifying the fracture of the distal radius it has been set and reduced and splinted, please see attached note.  Patient is understandable to the instructions and agreeable.  Final Clinical Impression(s) / ED Diagnoses Final diagnoses:  Fall  Other closed fracture of distal end of left radius, initial encounter  Closed fracture of multiple ribs of left side, initial encounter  Minor head injury, initial encounter    Rx / DC Orders ED Discharge Orders         Ordered    oxyCODONE-acetaminophen (PERCOCET/ROXICET) 5-325 MG tablet  Every 6 hours PRN        08/15/20 1532    naproxen (NAPROSYN) 500 MG tablet  2 times daily with meals        08/15/20 1532           Eber Hong, MD 08/15/20 1549

## 2020-08-15 NOTE — Consult Note (Addendum)
Reason for Consult:Left wrist fx Referring Physician: Fleet Contras Time called: 1353 Time at bedside: 1413   Maurice Mullen is an 50 y.o. male.  HPI: Maurice Mullen was about 7 feet up a ladder when he overextended and the ladder fell out from under him. He fell onto his left wrist and side. He had immediate left wrist and chest pain and was brought to the ED as a level 2 trauma activation. X-rays showed a left wrist fx and hand surgery was consulted. 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  No past medical history on file.  No family history on file.  Social History:  has no history on file for tobacco use, alcohol use, and drug use.  Allergies: Not on File  Medications: I have reviewed the patient's current medications.  Results for orders placed or performed during the hospital encounter of 08/15/20 (from the past 48 hour(s))  Comprehensive metabolic panel     Status: Abnormal   Collection Time: 08/15/20 11:54 AM  Result Value Ref Range   Sodium 137 135 - 145 mmol/L   Potassium 5.1 3.5 - 5.1 mmol/L    Comment: SPECIMEN HEMOLYZED. HEMOLYSIS MAY AFFECT INTEGRITY OF RESULTS.   Chloride 106 98 - 111 mmol/L   CO2 27 22 - 32 mmol/L   Glucose, Bld 122 (H) 70 - 99 mg/dL    Comment: Glucose reference range applies only to samples taken after fasting for at least 8 hours.   BUN 11 6 - 20 mg/dL   Creatinine, Ser 7.59 0.61 - 1.24 mg/dL   Calcium 8.6 (L) 8.9 - 10.3 mg/dL   Total Protein 6.1 (L) 6.5 - 8.1 g/dL   Albumin 3.4 (L) 3.5 - 5.0 g/dL   AST 37 15 - 41 U/L   ALT 23 0 - 44 U/L   Alkaline Phosphatase 49 38 - 126 U/L   Total Bilirubin 1.2 0.3 - 1.2 mg/dL   GFR, Estimated >16 >38 mL/min    Comment: (NOTE) Calculated using the CKD-EPI Creatinine Equation (2021)    Anion gap 4 (L) 5 - 15    Comment: Performed at [facility], [address], [place], [place] [address]  CBC     Status: None   Collection Time: 08/15/20 11:54 AM  Result Value Ref Range   WBC 8.4 4.0 -  10.5 K/uL   RBC 4.59 4.22 - 5.81 MIL/uL   Hemoglobin 14.2 13.0 - 17.0 g/dL   HCT 93.5 70.1 - 77.9 %   MCV 93.9 80.0 - 100.0 fL   MCH 30.9 26.0 - 34.0 pg   MCHC 32.9 30.0 - 36.0 g/dL   RDW 39.0 30.0 - 92.3 %   Platelets 272 150 - 400 K/uL   nRBC 0.0 0.0 - 0.2 %    Comment: Performed at [facility], [address], [place], [place] [address]    DG Ribs Unilateral W/Chest Left  Result Date: 08/15/2020 CLINICAL DATA:  Pain after fall from 7 foot ladder EXAM: LEFT RIBS AND CHEST - 3+ VIEW COMPARISON:  None. FINDINGS: Minimally displaced 2 part fractures of the left fifth and sixth ribs involving the posteromedial and posterolateral aspects of the ribs as well as possible nondisplaced posterior seventh and fourth rib fractures. There is no evidence of pneumothorax or pleural effusion. Both lungs are clear. Heart size and mediastinal contours are within normal limits. IMPRESSION: Minimally displaced 2 part fractures of the left fifth and sixth rads involving the posteromedial  and posterolateral aspects of the ribs as well as possible nondisplaced posterior seventh and fourth rib fractures. No pneumothorax. Electronically Signed   By: Maudry Mayhew MD   On: 08/15/2020 14:13   DG Forearm Left  Result Date: 08/15/2020 CLINICAL DATA:  Pain after fall from 7 foot ladder EXAM: LEFT FOREARM - 2 VIEW COMPARISON:  None. FINDINGS: Comminuted fracture of the distal radius with dorsal angulation and displacement of the dominant fracture fragment and extension into the radiocarpal joint with disruption of the distal radioulnar joint. The proximal radioulnar joint appear intact. No proximal radial or ulnar fracture visualized. Soft tissues are unremarkable. IMPRESSION: Comminuted fracture of the distal radius with dorsal angulation and displacement of the dominant fracture fragment and extension into the radiocarpal joint with disruption of the distal radioulnar joint. Electronically Signed   By: Maudry Mayhew  MD   On: 08/15/2020 14:19   DG Wrist Complete Left  Result Date: 08/15/2020 CLINICAL DATA:  Pain after fall from 7 foot ladder EXAM: LEFT WRIST - COMPLETE 3+ VIEW COMPARISON:  None. FINDINGS: Comminuted fracture of the distal radius with dorsal angulation and displacement of the dominant fracture fragment and extension into the radiocarpal joint with disruption of the distal radioulnar joint. IMPRESSION: Comminuted fracture of the distal radius, described above. Electronically Signed   By: Maudry Mayhew MD   On: 08/15/2020 14:10   DG Shoulder Left  Result Date: 08/15/2020 CLINICAL DATA:  Pain after fall from 7 foot ladder EXAM: LEFT SHOULDER - 2+ VIEW COMPARISON:  Same day rib radiographs. FINDINGS: The humerus is seated within the glenoid. Normal alignment of the AC joint. Minimally displaced left fifth and sixth rib fractures and probable nondisplaced left seventh rib fracture. Soft tissues are unremarkable. IMPRESSION: Humerus is seated within the glenoid with normal AC joint alignment. Minimally displaced left fifth and sixth rib fractures and probable nondisplaced left seventh rib fracture, better evaluated on same day rib radiographs. Electronically Signed   By: Maudry Mayhew MD   On: 08/15/2020 14:18    Review of Systems  HENT: Negative for ear discharge, ear pain, hearing loss and tinnitus.   Eyes: Negative for photophobia and pain.  Respiratory: Negative for cough and shortness of breath.   Cardiovascular: Positive for chest pain.  Gastrointestinal: Negative for abdominal pain, nausea and vomiting.  Genitourinary: Negative for dysuria, flank pain, frequency and urgency.  Musculoskeletal: Positive for arthralgias (Left wrist). Negative for back pain, myalgias and neck pain.  Neurological: Negative for dizziness and headaches.  Hematological: Does not bruise/bleed easily.  Psychiatric/Behavioral: The patient is not nervous/anxious.    Blood pressure 111/70, pulse 65, temperature (!)  96.8 F (36 C), temperature source Oral, resp. rate 14, height 6\' 3"  (1.905 m), weight 104.3 kg, SpO2 96 %. Physical Exam Constitutional:      General: He is not in acute distress.    Appearance: He is well-developed. He is not diaphoretic.  HENT:     Head: Normocephalic and atraumatic.  Eyes:     General: No scleral icterus.       Right eye: No discharge.        Left eye: No discharge.     Conjunctiva/sclera: Conjunctivae normal.  Cardiovascular:     Rate and Rhythm: Normal rate and regular rhythm.  Pulmonary:     Effort: Pulmonary effort is normal. No respiratory distress.  Musculoskeletal:     Cervical back: Normal range of motion.     Comments: Left shoulder, elbow, wrist, digits-  no skin wounds, mod TTP wrist, no instability, no blocks to motion  Sens  Ax/R/M/U intact  Mot   Ax/ R/ PIN/ M/ AIN/ U intact  Rad 2+  Skin:    General: Skin is warm and dry.  Neurological:     Mental Status: He is alert.  Psychiatric:        Behavior: Behavior normal.     Assessment/Plan: Left wrist fx -- Will place in sugar tong splint while in traps. F/u with Dr. Janee Morn in next couple of days to discuss surgery or he will see him here if he gets admitted.    Freeman Caldron, PA-C Orthopedic Surgery [phone] 08/15/2020, 2:23 PM   Comminuted displaced intra-articular DRFx, will benefit from ORIF.  Will have ED place in traps, apply ST splint while in traps, and I will arrange outpatient f/u and subsequent ORIF.  Neil Crouch, MD Hand Surgery

## 2020-08-16 ENCOUNTER — Encounter (HOSPITAL_BASED_OUTPATIENT_CLINIC_OR_DEPARTMENT_OTHER): Payer: Self-pay | Admitting: Orthopedic Surgery

## 2020-08-16 ENCOUNTER — Other Ambulatory Visit: Payer: Self-pay | Admitting: Orthopedic Surgery

## 2020-08-16 ENCOUNTER — Other Ambulatory Visit: Payer: Self-pay

## 2020-08-18 ENCOUNTER — Other Ambulatory Visit (HOSPITAL_COMMUNITY): Payer: No Typology Code available for payment source

## 2020-08-21 ENCOUNTER — Ambulatory Visit (HOSPITAL_BASED_OUTPATIENT_CLINIC_OR_DEPARTMENT_OTHER): Admit: 2020-08-21 | Payer: No Typology Code available for payment source | Admitting: Orthopedic Surgery

## 2020-08-21 HISTORY — DX: Fracture of unspecified carpal bone, left wrist, initial encounter for closed fracture: S62.102A

## 2020-08-21 HISTORY — DX: Gastro-esophageal reflux disease without esophagitis: K21.9

## 2020-08-21 HISTORY — DX: Fracture of one rib, unspecified side, initial encounter for closed fracture: S22.39XA

## 2020-08-21 SURGERY — OPEN REDUCTION INTERNAL FIXATION (ORIF) DISTAL RADIUS FRACTURE
Anesthesia: Monitor Anesthesia Care | Laterality: Left

## 2020-12-03 ENCOUNTER — Emergency Department: Payer: No Typology Code available for payment source

## 2020-12-03 ENCOUNTER — Other Ambulatory Visit: Payer: Self-pay

## 2020-12-03 ENCOUNTER — Encounter: Payer: Self-pay | Admitting: Emergency Medicine

## 2020-12-03 ENCOUNTER — Emergency Department
Admission: EM | Admit: 2020-12-03 | Discharge: 2020-12-03 | Disposition: A | Discharge status: Home / Self Care | Payer: No Typology Code available for payment source | Attending: Emergency Medicine | Admitting: Emergency Medicine

## 2020-12-03 DIAGNOSIS — R079 Chest pain, unspecified: Secondary | ICD-10-CM | POA: Insufficient documentation

## 2020-12-03 DIAGNOSIS — Z5321 Procedure and treatment not carried out due to patient leaving prior to being seen by health care provider: Secondary | ICD-10-CM | POA: Insufficient documentation

## 2020-12-03 DIAGNOSIS — R202 Paresthesia of skin: Secondary | ICD-10-CM | POA: Insufficient documentation

## 2020-12-03 LAB — COMPREHENSIVE METABOLIC PANEL
ALT: 18 U/L (ref 0–44)
AST: 17 U/L (ref 15–41)
Albumin: 3.9 g/dL (ref 3.5–5.0)
Alkaline Phosphatase: 61 U/L (ref 38–126)
Anion gap: 7 (ref 5–15)
BUN: 14 mg/dL (ref 6–20)
CO2: 26 mmol/L (ref 22–32)
Calcium: 9.5 mg/dL (ref 8.9–10.3)
Chloride: 105 mmol/L (ref 98–111)
Creatinine, Ser: 0.87 mg/dL (ref 0.61–1.24)
GFR, Estimated: >60 mL/min (ref >60)
Glucose, Bld: 113 mg/dL — ABNORMAL HIGH (ref 70–99)
Potassium: 3.8 mmol/L (ref 3.5–5.1)
Sodium: 138 mmol/L (ref 135–145)
Total Bilirubin: 0.7 mg/dL (ref 0.3–1.2)
Total Protein: 7.2 g/dL (ref 6.5–8.1)

## 2020-12-03 LAB — CBC WITH DIFFERENTIAL/PLATELET
Abs Immature Granulocytes: 0.01 10*3/uL (ref 0.00–0.07)
Basophils Absolute: 0.1 10*3/uL (ref 0.0–0.1)
Basophils Relative: 1 %
Eosinophils Absolute: 0.2 10*3/uL (ref 0.0–0.5)
Eosinophils Relative: 4 %
HCT: 44.8 % (ref 39.0–52.0)
Hemoglobin: 15.3 g/dL (ref 13.0–17.0)
Immature Granulocytes: 0 %
Lymphocytes Relative: 43 %
Lymphs Abs: 2.7 10*3/uL (ref 0.7–4.0)
MCH: 31.7 pg (ref 26.0–34.0)
MCHC: 34.2 g/dL (ref 30.0–36.0)
MCV: 92.8 fL (ref 80.0–100.0)
Monocytes Absolute: 0.5 10*3/uL (ref 0.1–1.0)
Monocytes Relative: 8 %
Neutro Abs: 2.8 10*3/uL (ref 1.7–7.7)
Neutrophils Relative %: 44 %
Platelets: 266 10*3/uL (ref 150–400)
RBC: 4.83 MIL/uL (ref 4.22–5.81)
RDW: 11.8 % (ref 11.5–15.5)
WBC: 6.3 10*3/uL (ref 4.0–10.5)
nRBC: 0 % (ref 0.0–0.2)

## 2020-12-03 LAB — TROPONIN I (HIGH SENSITIVITY): Troponin I (High Sensitivity): 4 ng/L (ref <18)

## 2020-12-03 LAB — LIPASE, BLOOD: Lipase: 34 U/L (ref 11–51)

## 2020-12-03 IMAGING — CT CT HEAD W/O CM
3 series · 16 of 47 positions shown, 19 images · non-contrast
Comparison: None.

CLINICAL DATA: Left upper extremity numbness.

EXAM:
CT HEAD WITHOUT CONTRAST
TECHNIQUE: Contiguous axial images were obtained from the base of the skull
through the vertex without intravenous contrast.

[Series 2: head wo · axial · 0.45mm/px · z∈[-90,+55]mm · 10 of 35 slices shown, 13 images]
[im 3/35  brain]
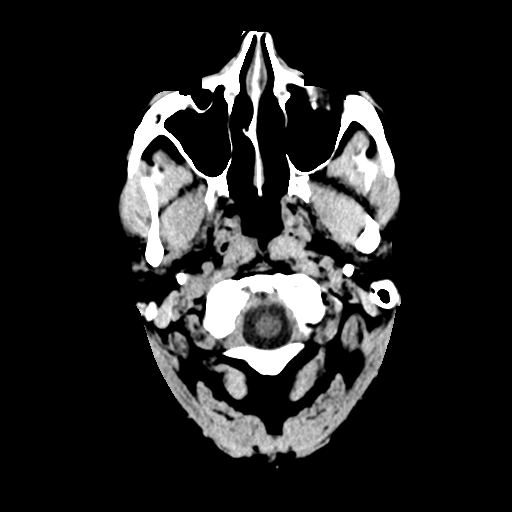
[im 3/35  bone]
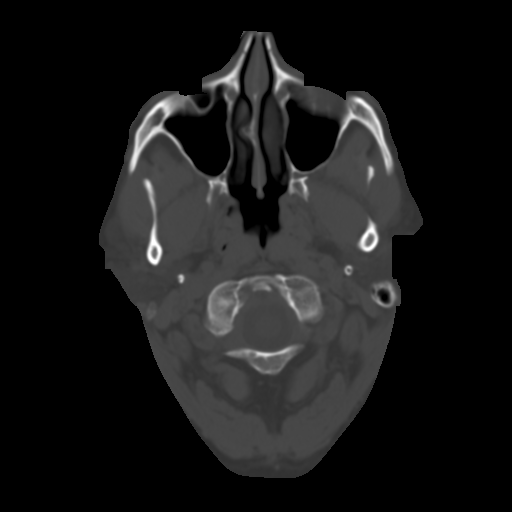
[im 6/35  brain]
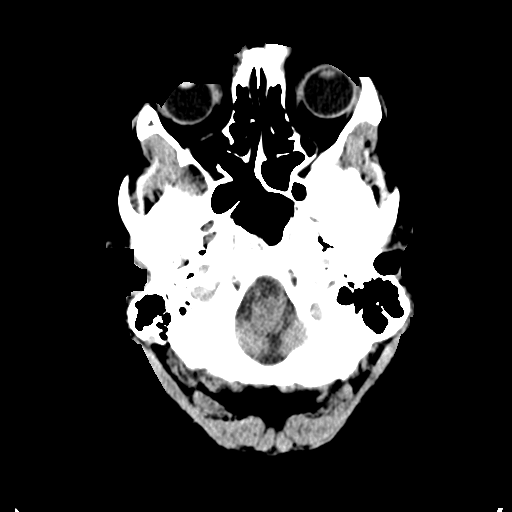
[im 10/35  brain]
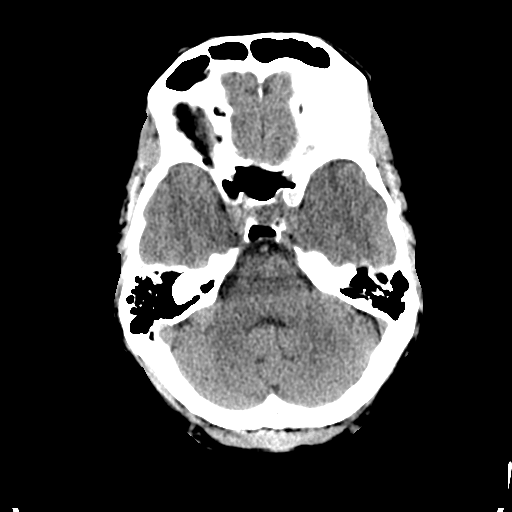
[im 12/35  brain]
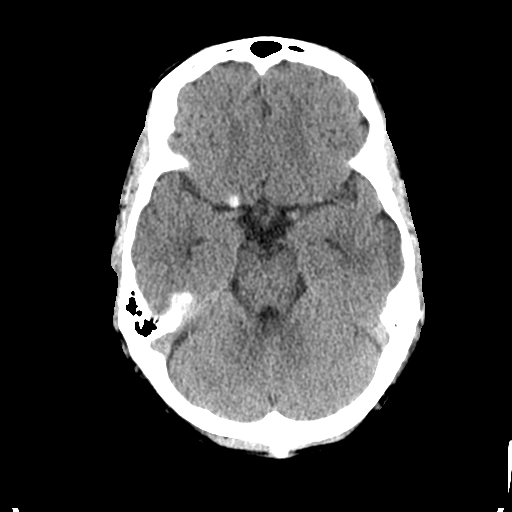
[im 16/35  brain]
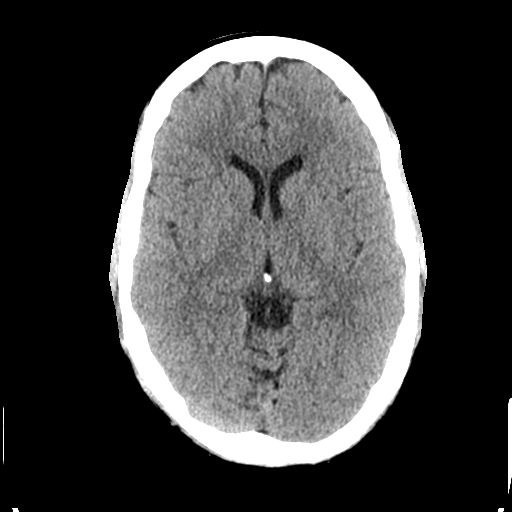
[im 16/35  bone]
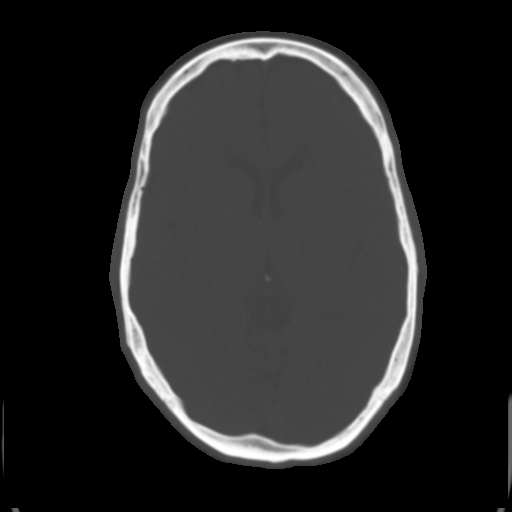
[im 19/35  brain]
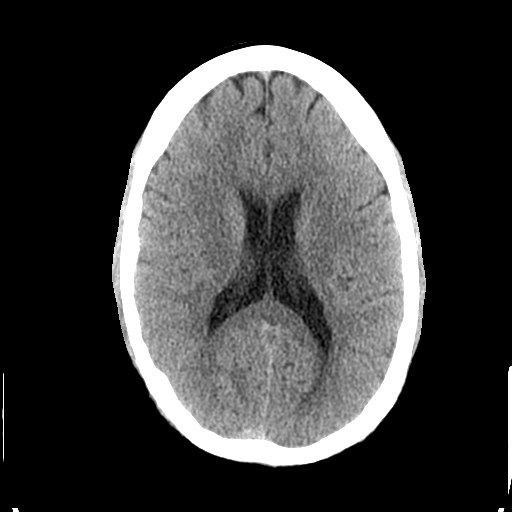
[im 23/35  brain]
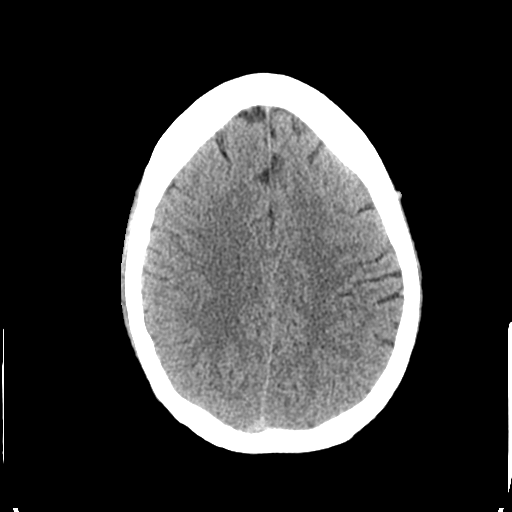
[im 26/35  brain]
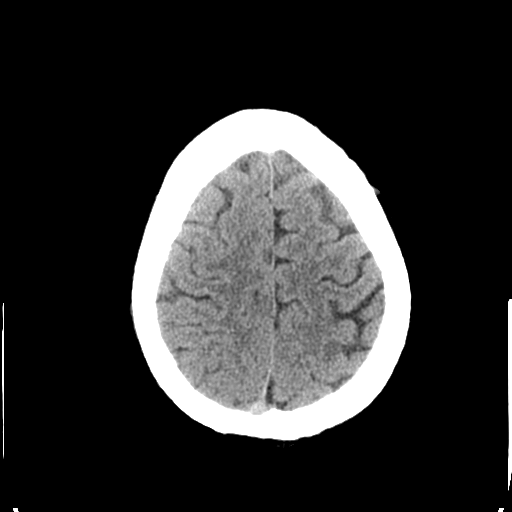
[im 29/35  brain]
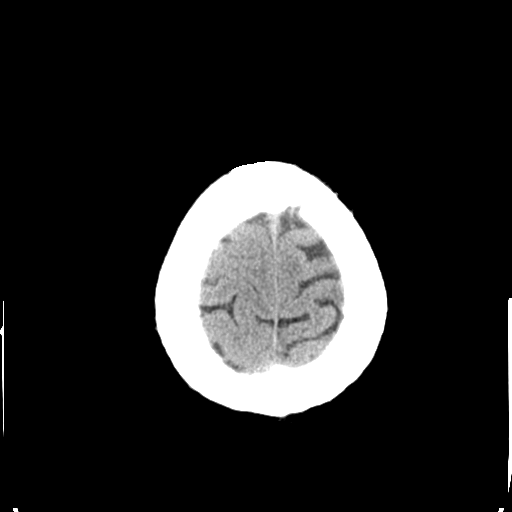
[im 29/35  bone]
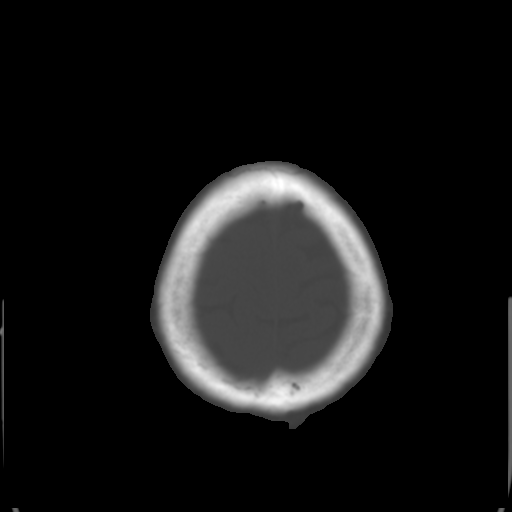
[im 32/35  brain]
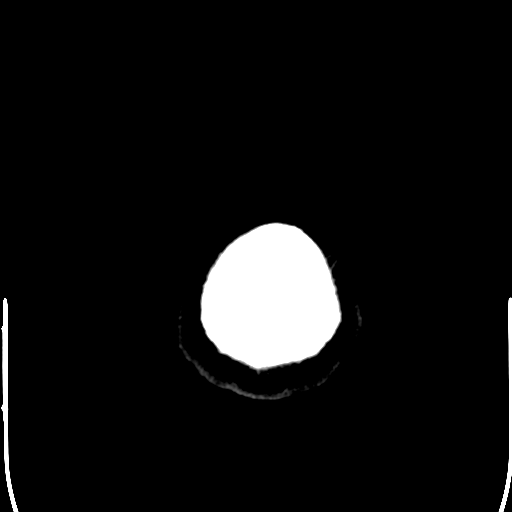

[Series 4: coronal soft tissue · coronal · 0.33mm/px · 3 of 70 slices shown]
[im 24/70  brain]
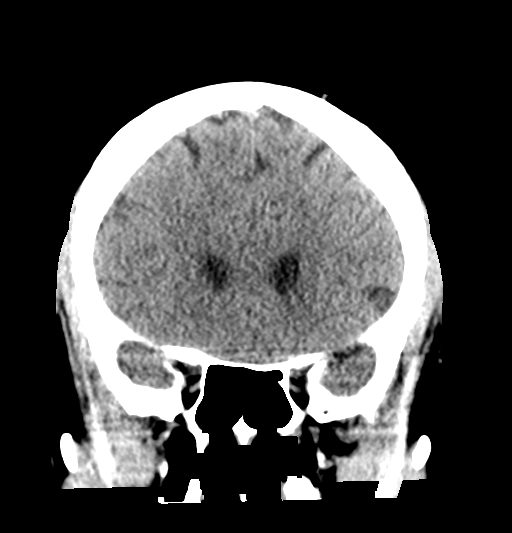
[im 31/70  brain]
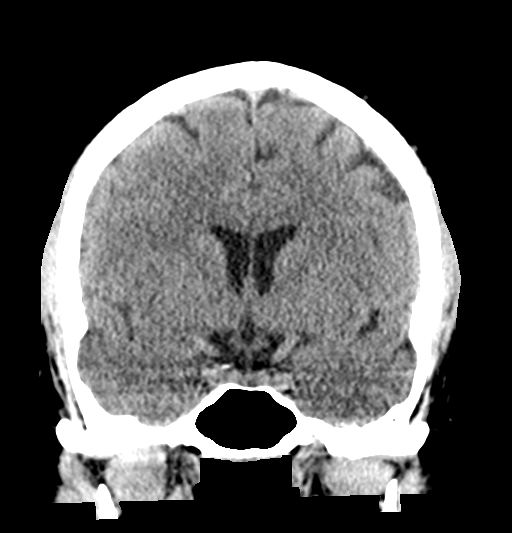
[im 39/70  brain]
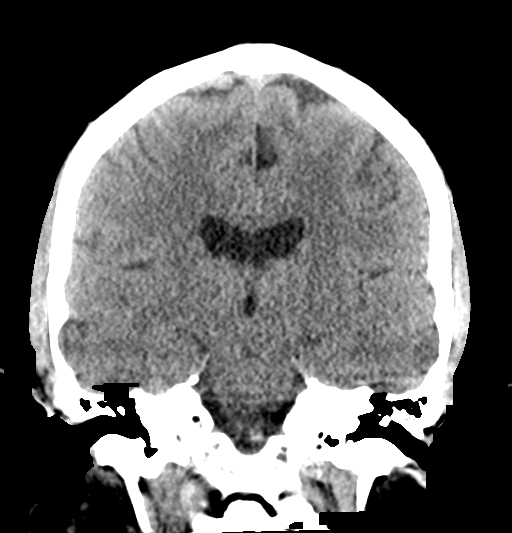

[Series 5: sagittal soft tissue · sagittal · 0.35mm/px · 3 of 57 slices shown]
[im 19/57  brain]
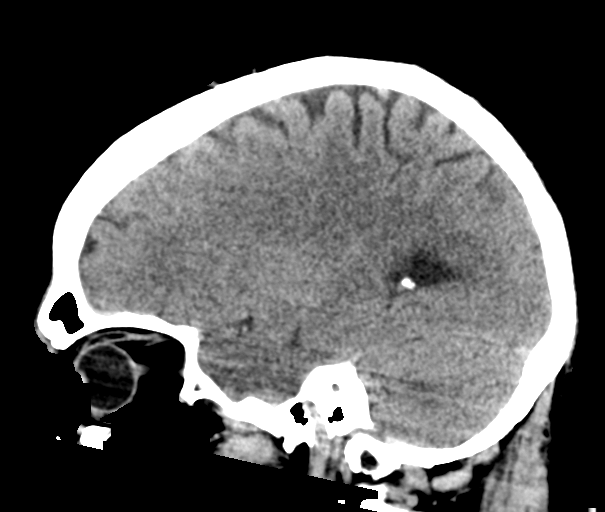
[im 29/57  brain]
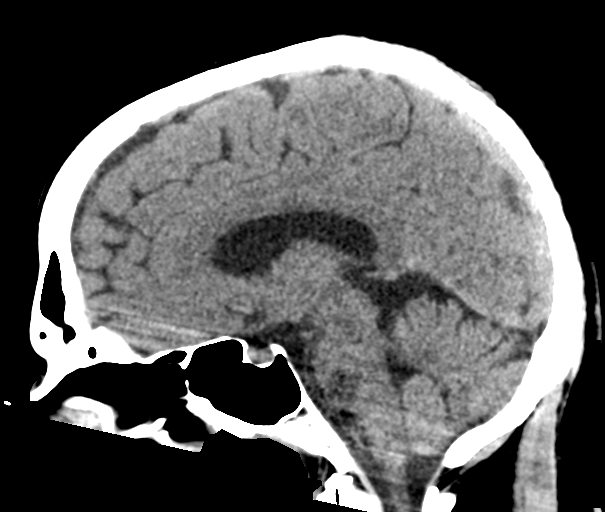
[im 38/57  brain]
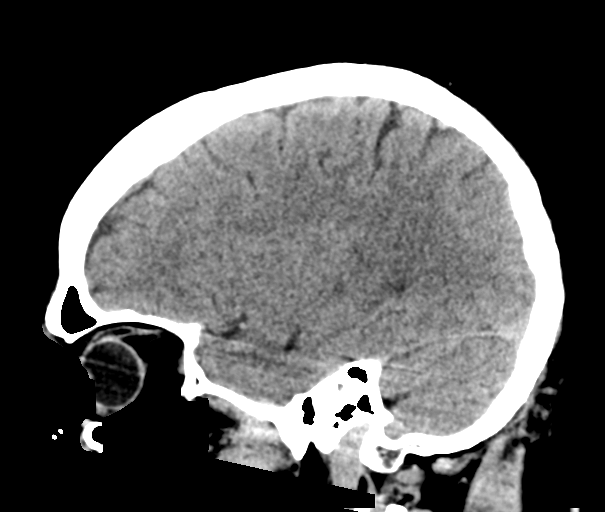

[16 of 47 positions shown; findings below may reference images not displayed]

FINDINGS: Brain: There is no evidence for acute hemorrhage, hydrocephalus,
mass lesion, or abnormal extra-axial fluid collection. No definite
CT evidence for acute infarction. There is no evidence for acute
hemorrhage, hydrocephalus, mass lesion, or abnormal extra-axial
fluid collection. No definite CT evidence for acute infarction.

Vascular: No hyperdense vessel or unexpected calcification.

Skull: Normal. Negative for fracture or focal lesion.

Sinuses/Orbits: The visualized paranasal sinuses and mastoid air
cells are clear. Visualized portions of the globes and intraorbital
fat are unremarkable.

Other: None.
IMPRESSION: Unremarkable study.  No acute intracranial abnormality.

## 2020-12-03 IMAGING — CR DG CHEST 2V
2 series · 2 of 2 positions shown · non-contrast
Comparison: [DATE]

CLINICAL DATA: Central chest pain with left arm numbness.

EXAM:
CHEST - 2 VIEW

[chest pa]
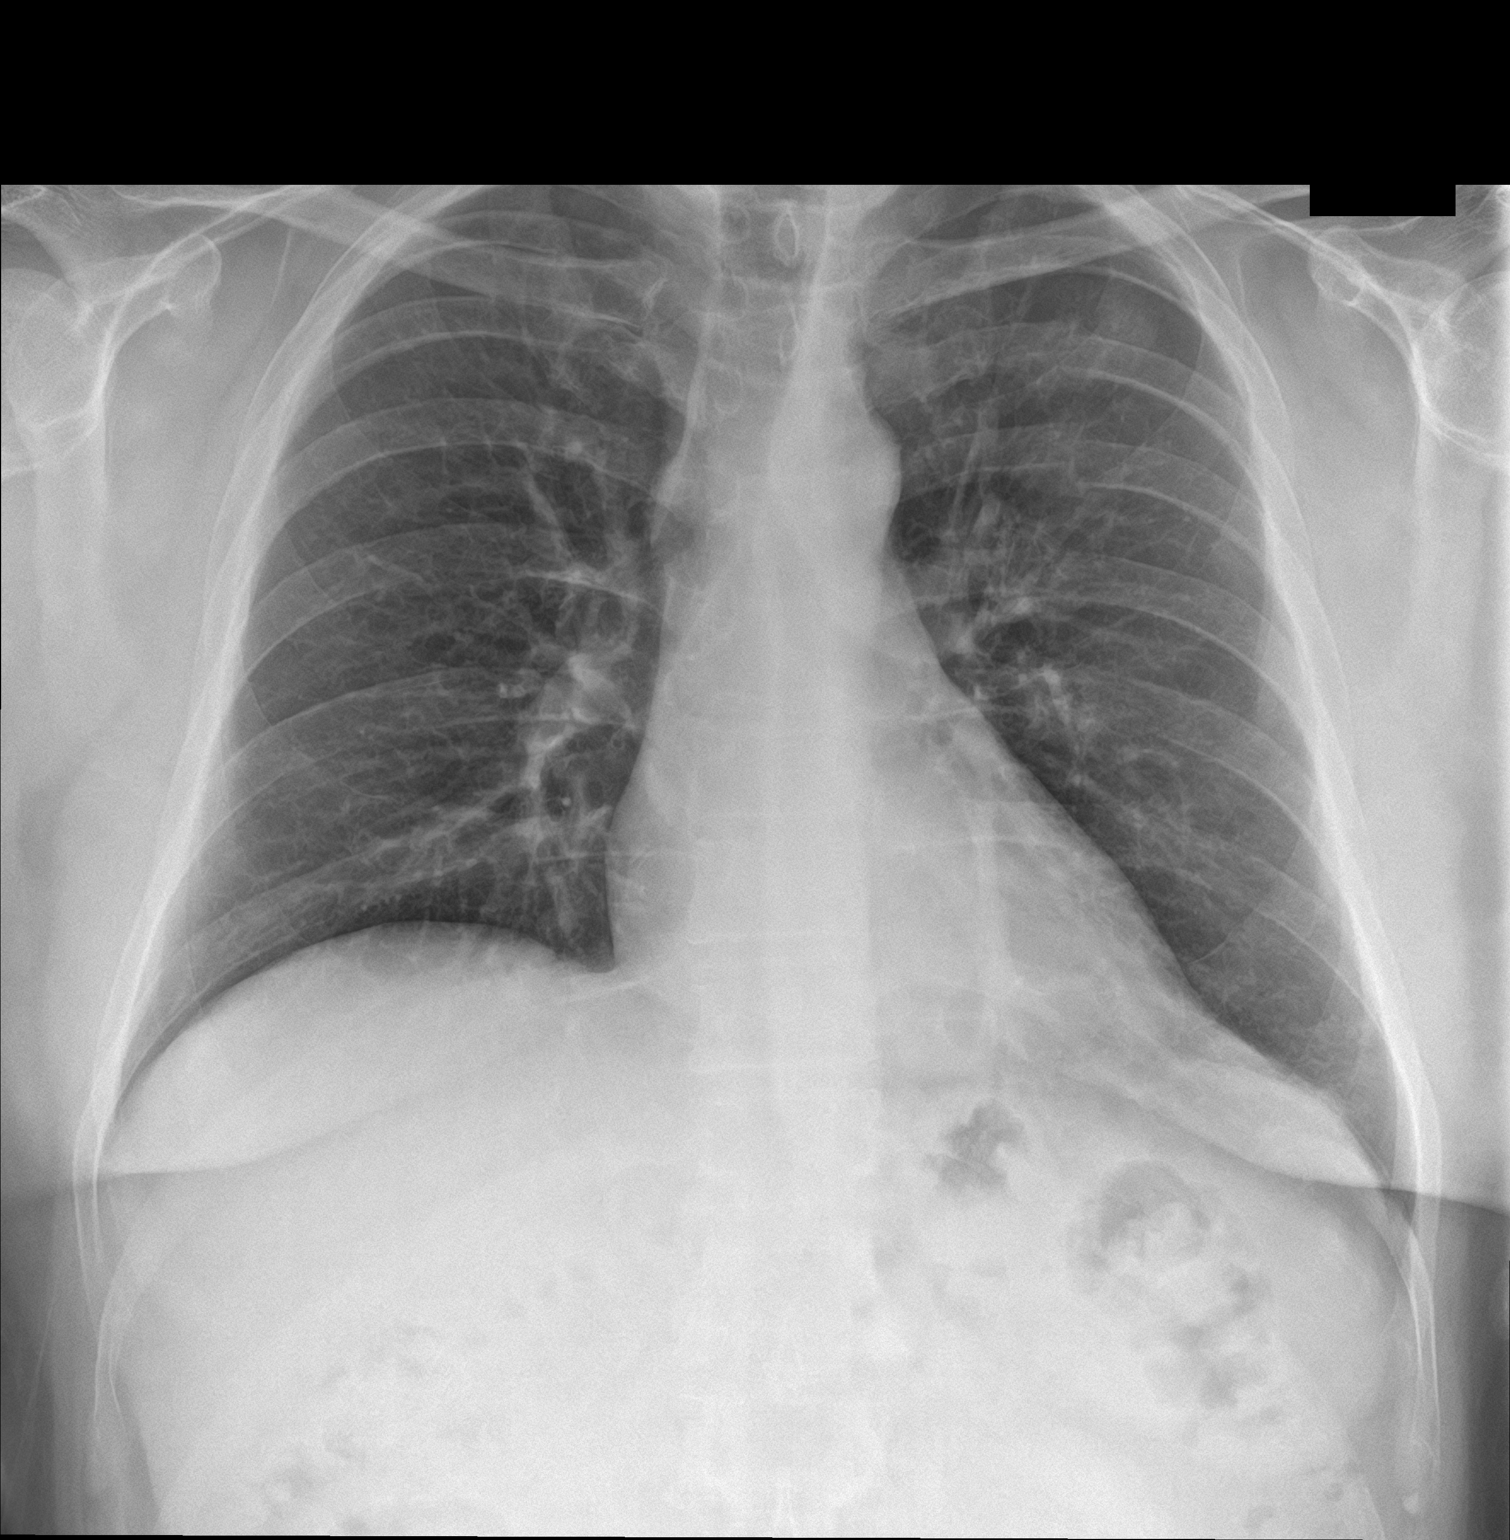

[chest lat]
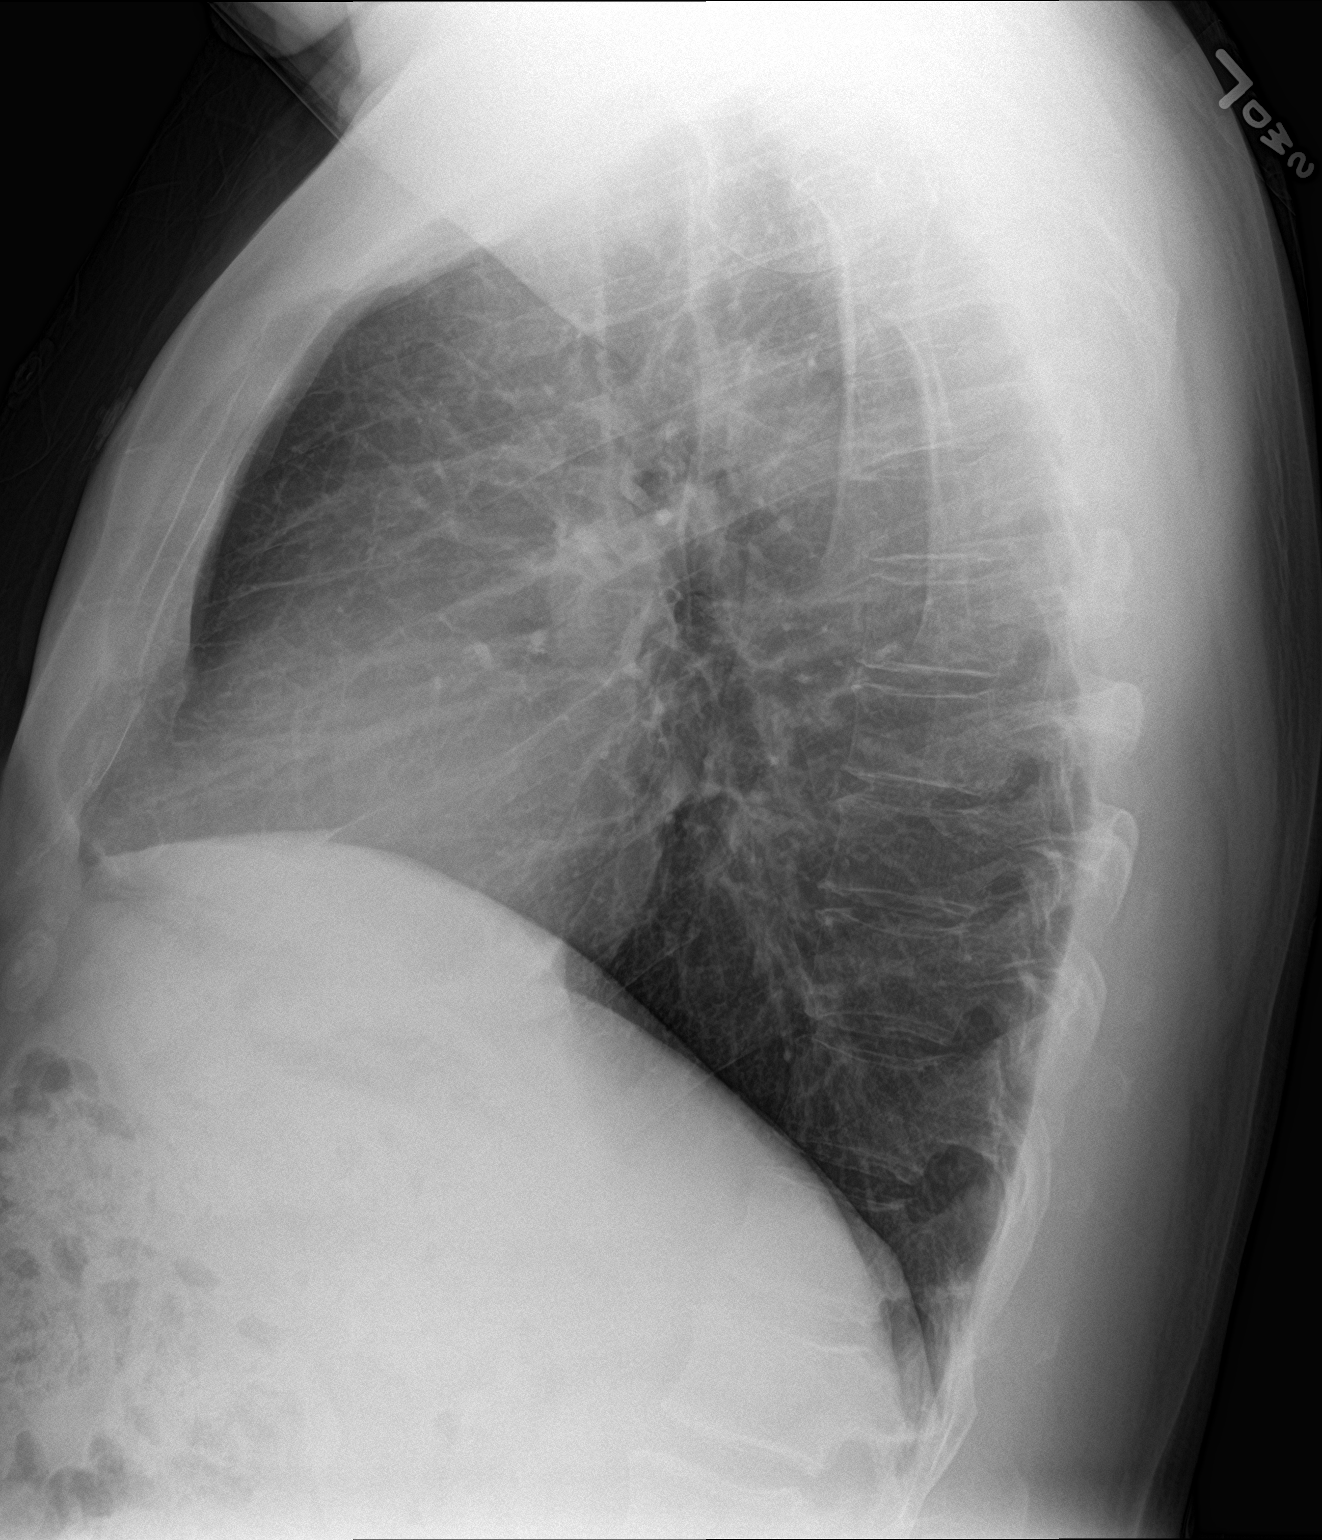

[2 of 2 positions shown; findings below may reference images not displayed]

FINDINGS: The lungs are clear without focal pneumonia, edema, pneumothorax or
pleural effusion. The cardiopericardial silhouette is within normal
limits for size. The visualized bony structures of the thorax show
no acute abnormality.
IMPRESSION: No active cardiopulmonary disease.

## 2020-12-03 NOTE — ED Provider Notes (Signed)
Emergency Medicine Provider Triage Evaluation Note  Maurice Mullen , a 50 y.o. male  was evaluated in triage.  Pt complains of chest pain, LUE numbness.  Review of Systems  Positive: Chest pain, LUE numbness Negative: Shortness of breath  Physical Exam  BP 124/82 (BP Location: Left Arm)   Pulse 63   Temp 98 F (36.7 C) (Oral)   Resp 18   SpO2 97%  Gen:   Awake, no distress   Resp:  Normal effort  MSK:   Moves extremities without difficulty  Other:  Slightly weaker left grip; patient reports left arm surgery 4 months ago with residual numbness from wrist to hand but this morning entire arm is numb  Medical Decision Making  Medically screening exam initiated at 6:35 AM.  Appropriate orders placed.  Adi Doro was informed that the remainder of the evaluation will be completed by another provider, this initial triage assessment does not replace that evaluation, and the importance of remaining in the ED until their evaluation is complete.  50 year old male presenting with chest pain and LUE numbness.  Will obtain cardiac panel, CT head while patient awaits treatment room.   Irean Hong, MD 12/03/20 [phone]

## 2020-12-03 NOTE — ED Notes (Signed)
Pt to desk, reports he is leaving and will follow up with his MD and have him review the results.

## 2020-12-03 NOTE — ED Triage Notes (Signed)
Pt comes into the ED via POV c/o central chest pain with left arm numbness. Pt denies any current pain at this time.  Pt denies any cardiac history.  Pt states his left arm feels "cooler to the touch" than normal.  Pt ambulatory with no distress noted.

## 2021-04-25 ENCOUNTER — Other Ambulatory Visit: Payer: Self-pay | Admitting: Physician Assistant

## 2021-04-25 DIAGNOSIS — Z86718 Personal history of other venous thrombosis and embolism: Secondary | ICD-10-CM

## 2021-04-25 DIAGNOSIS — R519 Headache, unspecified: Secondary | ICD-10-CM

## 2021-04-25 DIAGNOSIS — R42 Dizziness and giddiness: Secondary | ICD-10-CM

## 2021-04-25 DIAGNOSIS — H539 Unspecified visual disturbance: Secondary | ICD-10-CM

## 2021-04-25 DIAGNOSIS — H538 Other visual disturbances: Secondary | ICD-10-CM

## 2021-04-25 DIAGNOSIS — S00259A Superficial foreign body of unspecified eyelid and periocular area, initial encounter: Secondary | ICD-10-CM

## 2021-05-08 ENCOUNTER — Other Ambulatory Visit: Payer: Self-pay | Admitting: Physician Assistant

## 2021-05-08 DIAGNOSIS — M5010 Cervical disc disorder with radiculopathy, unspecified cervical region: Secondary | ICD-10-CM

## 2021-05-10 ENCOUNTER — Ambulatory Visit
Admission: RE | Admit: 2021-05-10 | Discharge: 2021-05-10 | Disposition: A | Discharge status: Home / Self Care | Payer: No Typology Code available for payment source | Source: Ambulatory Visit | Attending: Physician Assistant | Admitting: Physician Assistant

## 2021-05-10 DIAGNOSIS — S00259A Superficial foreign body of unspecified eyelid and periocular area, initial encounter: Secondary | ICD-10-CM | POA: Insufficient documentation

## 2021-05-15 ENCOUNTER — Ambulatory Visit: Payer: No Typology Code available for payment source

## 2021-05-15 ENCOUNTER — Ambulatory Visit
Admission: RE | Admit: 2021-05-15 | Discharge: 2021-05-15 | Disposition: A | Discharge status: Home / Self Care | Payer: No Typology Code available for payment source | Source: Ambulatory Visit | Attending: Physician Assistant | Admitting: Physician Assistant

## 2021-05-15 ENCOUNTER — Other Ambulatory Visit: Payer: Self-pay

## 2021-05-15 ENCOUNTER — Other Ambulatory Visit: Payer: Non-veteran care

## 2021-05-15 DIAGNOSIS — Z86718 Personal history of other venous thrombosis and embolism: Secondary | ICD-10-CM

## 2021-05-15 DIAGNOSIS — R42 Dizziness and giddiness: Secondary | ICD-10-CM | POA: Diagnosis present

## 2021-05-15 DIAGNOSIS — H538 Other visual disturbances: Secondary | ICD-10-CM

## 2021-05-15 DIAGNOSIS — R519 Headache, unspecified: Secondary | ICD-10-CM

## 2021-05-15 DIAGNOSIS — H539 Unspecified visual disturbance: Secondary | ICD-10-CM | POA: Diagnosis present

## 2021-05-15 IMAGING — MR MR HEAD WO/W CM
13 series · 48 of 48 positions shown · IV contrast (gadavist)
Comparison: No pertinent prior exam.

CLINICAL DATA: Pressure in head. Blurred vision. Dizziness. History
of cerebral thrombosis. Additional history provided by scanning
technologist: Patient reports history of thrombosis in sigmoid sinus
in [5X]. Symptoms since that time (head pressure, decreased vision,
brain fog, mild depression).

EXAM:
MRI HEAD WITHOUT AND WITH CONTRAST
MR VENOGRAM HEAD WITHOUT AND WITH CONTRAST
TECHNIQUE: Multiplanar, multi-echo pulse sequences of the brain and surrounding
structures were acquired without and with intravenous contrast.
Angiographic images of the intracranial venous structures were
acquired using MRV technique without and with intravenous contrast.
CONTRAST:  10mL GADAVIST GADOBUTROL 1 MMOL/ML IV SOLN

[Series 5: ax dwi_tracew · axial · 3.0mm · 0.71mm/px · z∈[-64,+100]mm · 4 of 56 slices shown]
[im 1/56]
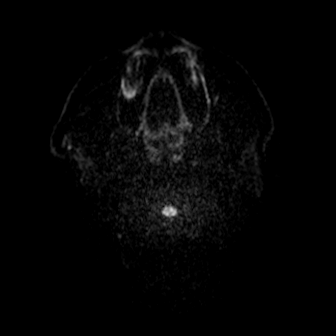
[im 19/56]
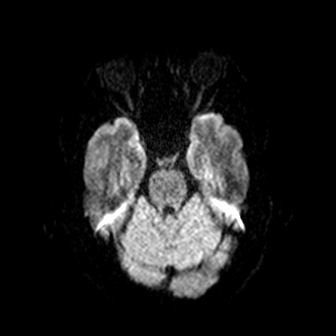
[im 37/56]
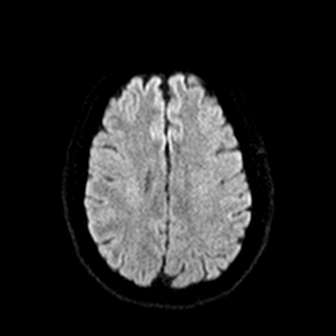
[im 56/56]
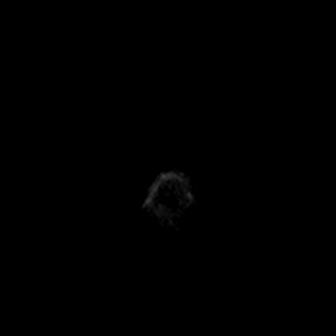

[Series 6: ax dwi_adc · axial · 3.0mm · 0.71mm/px · z∈[-64,+100]mm · 4 of 56 slices shown]
[im 1/56]
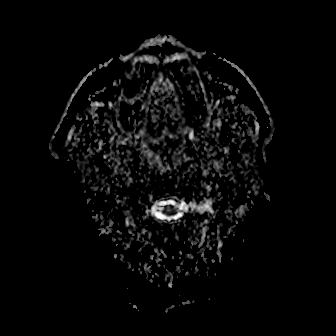
[im 19/56]
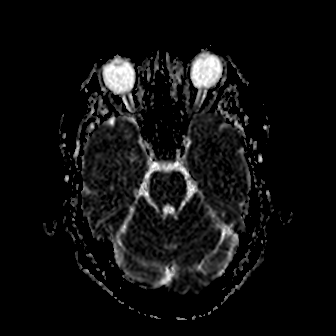
[im 37/56]
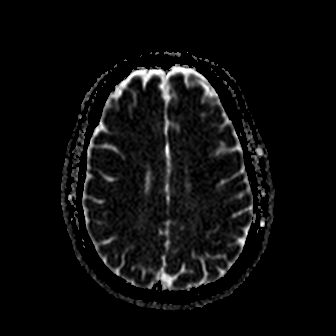
[im 56/56]
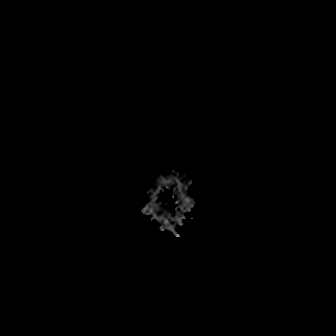

[Series 7: cor dwi_tracew · coronal · 5.0mm · 0.68mm/px · 2 of 40 slices shown]
[im 1/40]
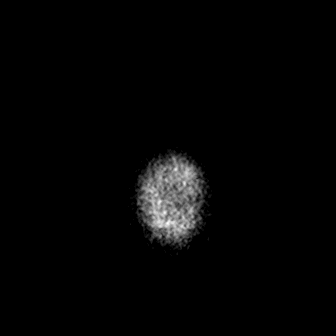
[im 40/40]
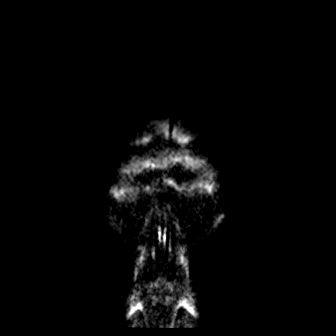

[Series 8: cor dwi_adc · coronal · 5.0mm · 0.68mm/px · 2 of 40 slices shown]
[im 1/40]
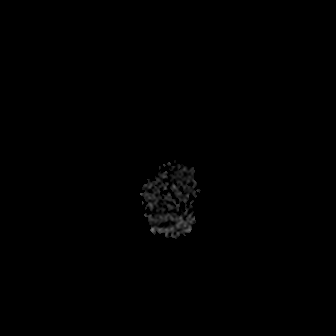
[im 40/40]
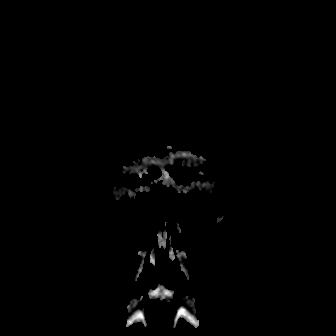

[Series 9: T1 · sagittal · 5.0mm · 0.62mm/px · 1 of 23 slices shown (1 of 2)]
[im 1/23]
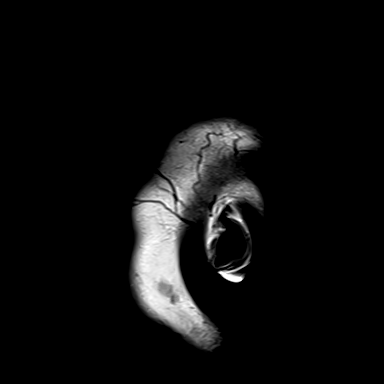

[Series 10: T2 · axial · 5.0mm · 0.53mm/px · z∈[-58,+97]mm · 2 of 27 slices shown]
[im 1/27]
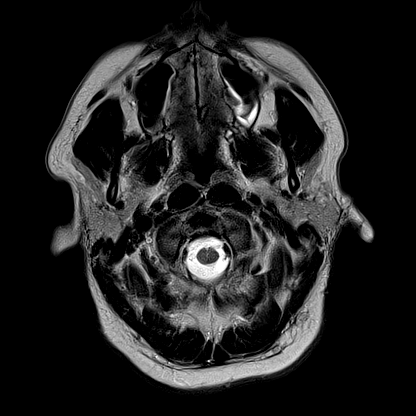
[im 27/27]
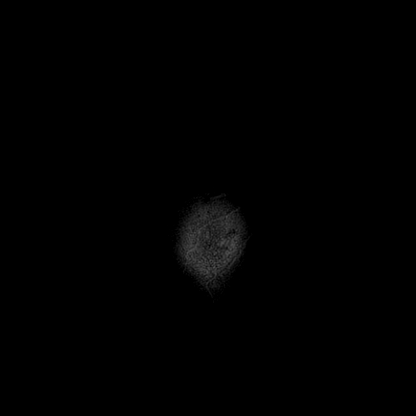

[Series 12: pha_images · axial · 3.0mm · 0.90mm/px · z∈[-56,+96]mm · 3 of 52 slices shown]
[im 1/52]
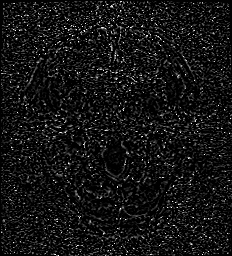
[im 26/52]
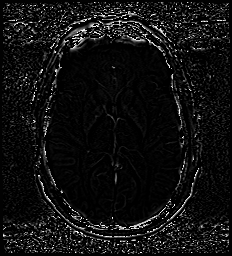
[im 52/52]
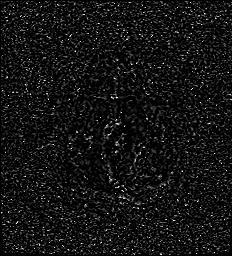

[Series 13: swi_images · axial · 3.0mm · 0.90mm/px · z∈[-56,+96]mm · 3 of 52 slices shown]
[im 1/52]
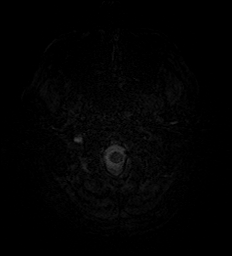
[im 26/52]
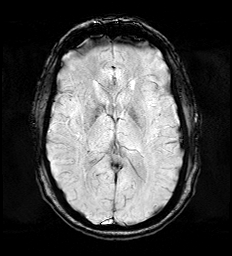
[im 52/52]
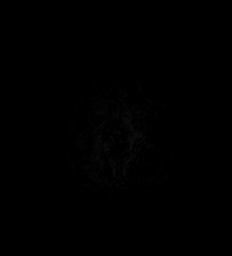

[Series 15: FLAIR · axial · 3.0mm · 0.69mm/px · z∈[-61,+100]mm · 3 of 55 slices shown]
[im 1/55]
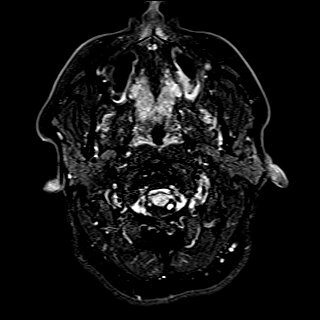
[im 28/55]
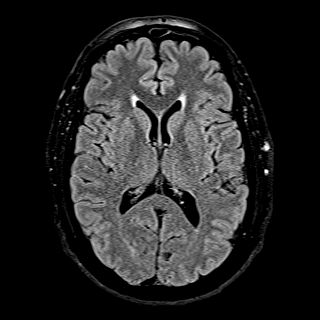
[im 55/55]
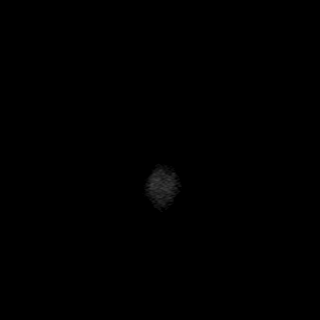

[Series 16: T1 · axial · 1.0mm · 0.98mm/px · z∈[-62,+112]mm · 10 of 176 slices shown (2 of 2)]
[im 1/176]
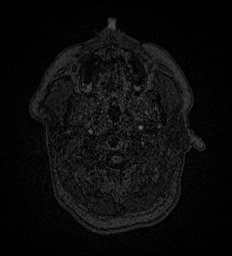
[im 20/176]
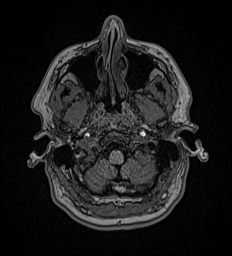
[im 39/176]
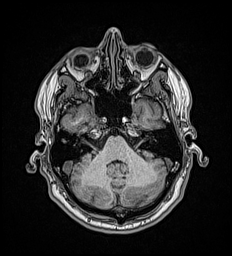
[im 59/176]
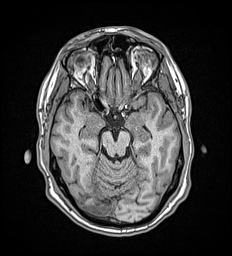
[im 78/176]
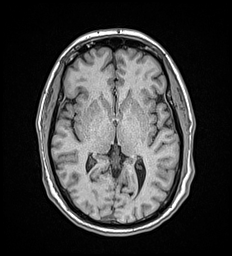
[im 98/176]
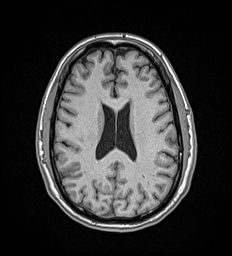
[im 117/176]
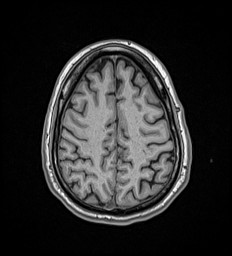
[im 137/176]
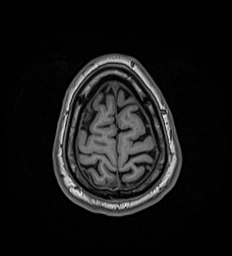
[im 156/176]
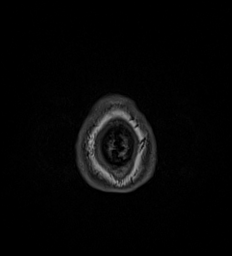
[im 176/176]
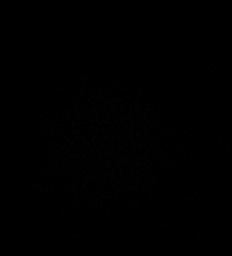

[Series 17: T2 post-contrast · coronal · 5.0mm · 0.57mm/px · 2 of 31 slices shown]
[im 1/31]
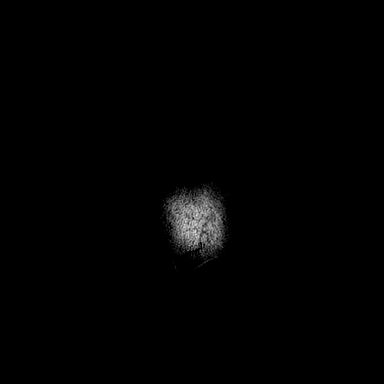
[im 31/31]
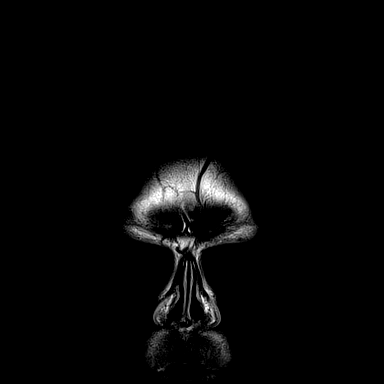

[Series 18: T1 post-contrast · axial · 1.0mm · 0.98mm/px · z∈[-62,+112]mm · 10 of 176 slices shown (1 of 2)]
[im 1/176]
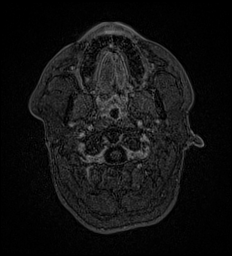
[im 20/176]
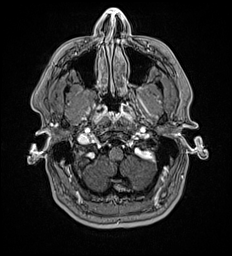
[im 39/176]
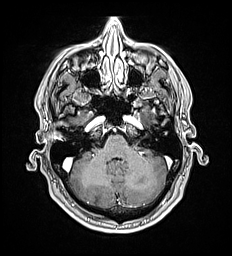
[im 59/176]
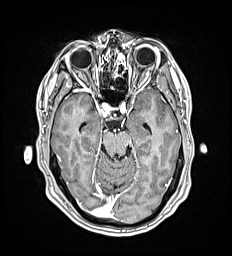
[im 78/176]
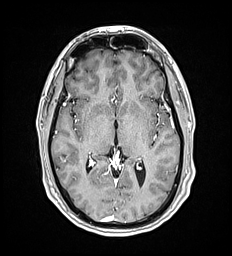
[im 98/176]
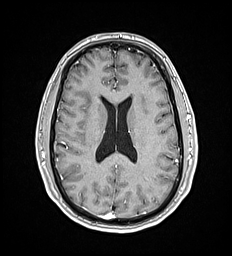
[im 117/176]
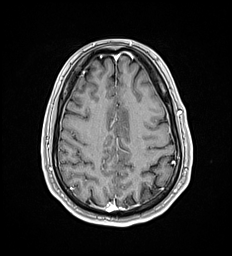
[im 137/176]
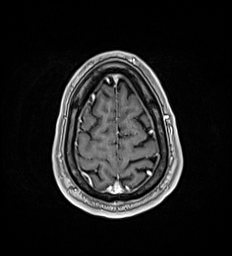
[im 156/176]
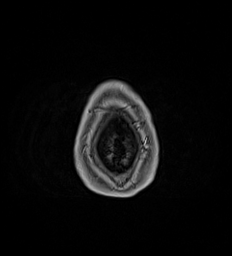
[im 176/176]
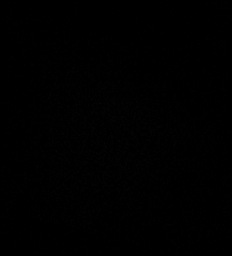

[Series 19: T1 post-contrast · coronal · 5.0mm · 0.57mm/px · 2 of 31 slices shown (2 of 2)]
[im 1/31]
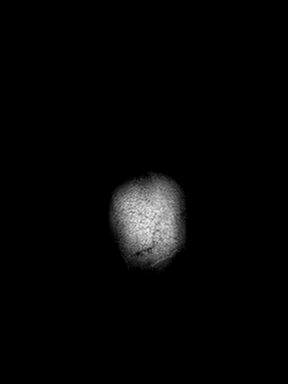
[im 31/31]
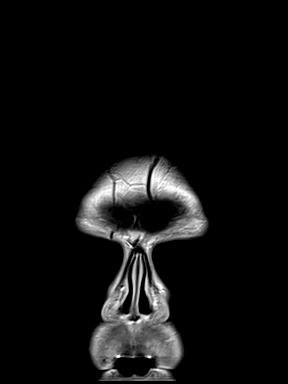

[48 of 48 positions shown; findings below may reference images not displayed]

FINDINGS: MRI HEAD WITHOUT AND WITH CONTRAST

Brain:

Cerebral volume is normal.

3 mm focus of T2 FLAIR hyperintense signal abnormality within the
left frontal lobe white matter (series 15, image 34).

No cortical encephalomalacia is identified.

There is no acute infarct.

No evidence of an intracranial mass.

No chronic intracranial blood products.

No extra-axial fluid collection.

No midline shift.

No pathologic intracranial enhancement identified.

Vascular: Maintained flow voids within the proximal large arterial
vessels.

Skull and upper cervical spine: No focal suspicious marrow lesion.

Sinuses/Orbits: Visualized orbits show no acute finding. Minimal
mucosal thickening within the bilateral frontal, ethmoid and
maxillary sinuses.

MR VENOGRAM HEAD WITHOUT AND WITH CONTRAST

The superior sagittal sinus, internal cerebral veins, vein of OMPONYE,
straight sinus, transverse sinuses, sigmoid sinuses and visualized
jugular veins are patent. No evidence of intracranial venous
thrombosis. The right transverse and sigmoid dural venous sinuses
are dominant.
IMPRESSION: MRI brain:

1. No evidence of acute intracranial abnormality.
2. 3 mm focus of T2 FLAIR hyperintense signal abnormality within the
left frontal lobe white matter, nonspecific and of doubtful clinical
significance as an isolated finding.
3. Otherwise unremarkable MRI appearance of the brain.
4. Mild paranasal sinus mucosal thickening.

MRV head:

No evidence of intracranial venous thrombosis.

## 2021-05-15 IMAGING — MR MR MRV HEAD WO/W CM
4 of 7 series · 11 of 48 positions shown · IV contrast (gadavist)
Comparison: No pertinent prior exam.

CLINICAL DATA: Pressure in head. Blurred vision. Dizziness. History
of cerebral thrombosis. Additional history provided by scanning
technologist: Patient reports history of thrombosis in sigmoid sinus
in [5X]. Symptoms since that time (head pressure, decreased vision,
brain fog, mild depression).

EXAM:
MRI HEAD WITHOUT AND WITH CONTRAST
MR VENOGRAM HEAD WITHOUT AND WITH CONTRAST
TECHNIQUE: Multiplanar, multi-echo pulse sequences of the brain and surrounding
structures were acquired without and with intravenous contrast.
Angiographic images of the intracranial venous structures were
acquired using MRV technique without and with intravenous contrast.
CONTRAST:  10mL GADAVIST GADOBUTROL 1 MMOL/ML IV SOLN

[Series 1: TOF · coronal · 2.5mm · 0.98mm/px · 3 of 112 slices shown]
[im 23/112]
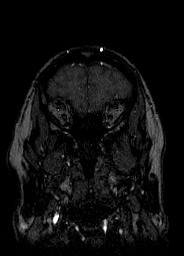
[im 67/112]
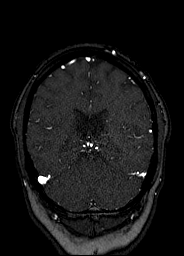
[im 112/112]
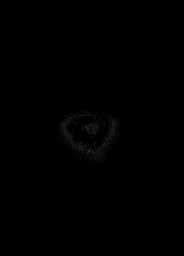

[Series 11: T1 post-contrast · axial · 1.0mm · 0.98mm/px · z∈[-41,+90]mm · 3 of 176 slices shown]
[im 22/176]
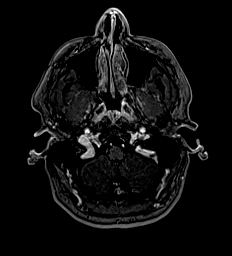
[im 88/176]
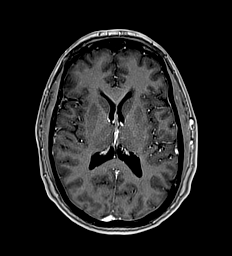
[im 154/176]
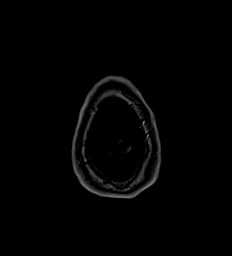

[Series 1065: mpr sag 1mm · sagittal · 1.0mm · 0.26mm/px · 3 of 163 slices shown]
[im 21/163]
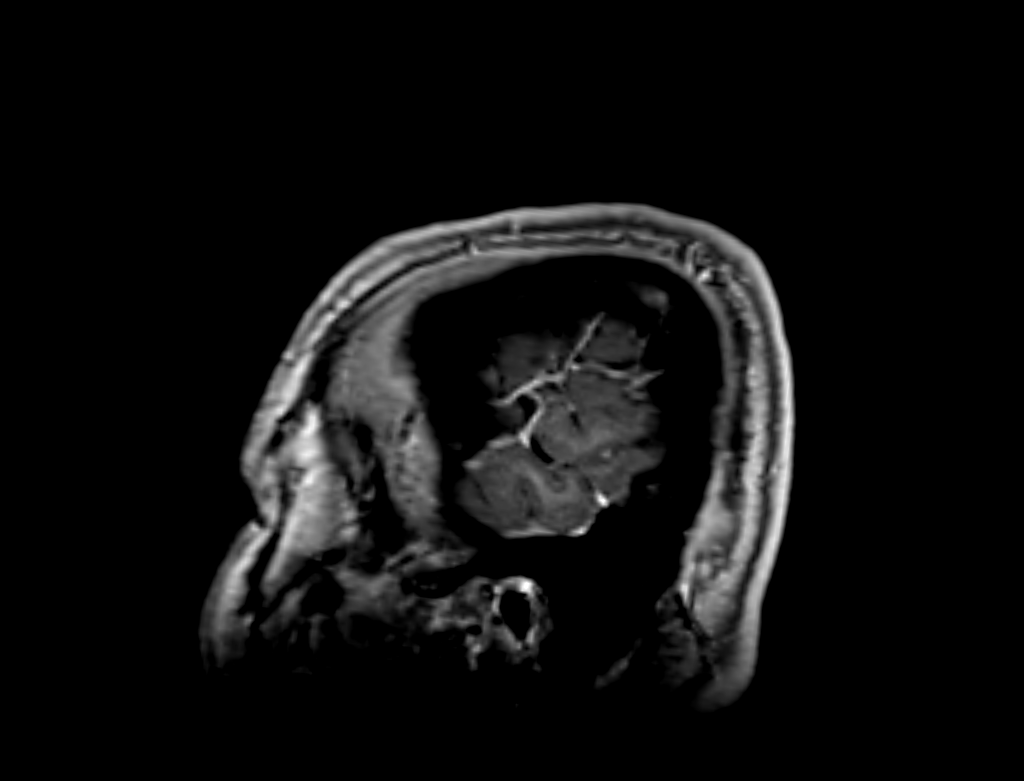
[im 82/163]
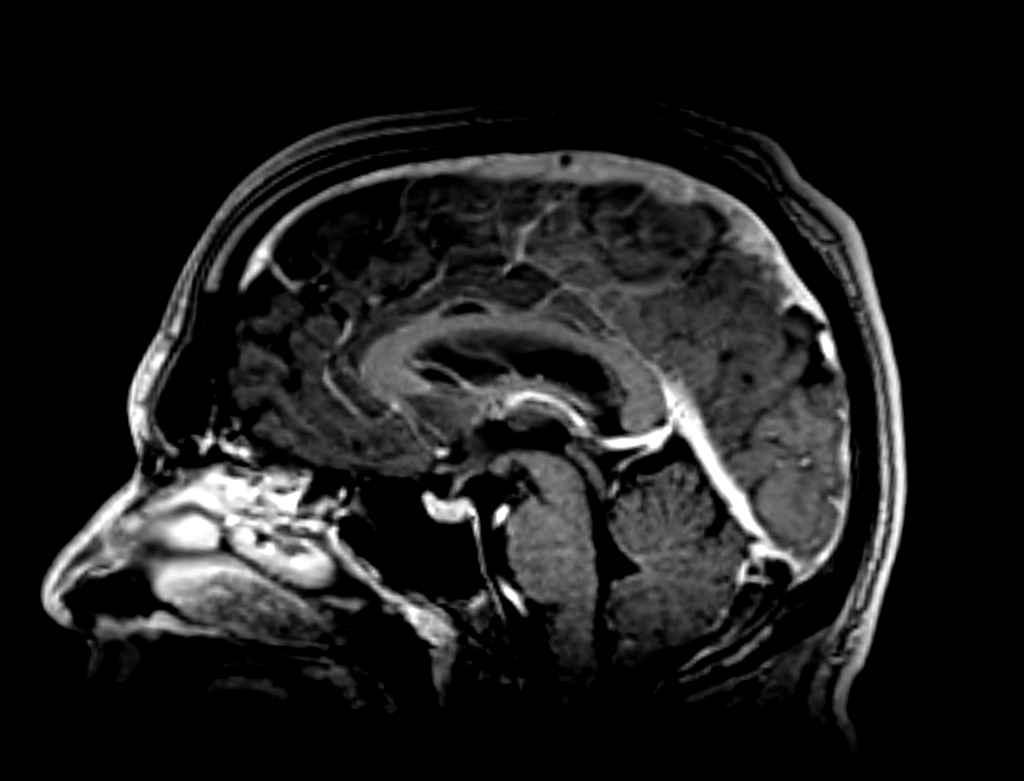
[im 142/163]
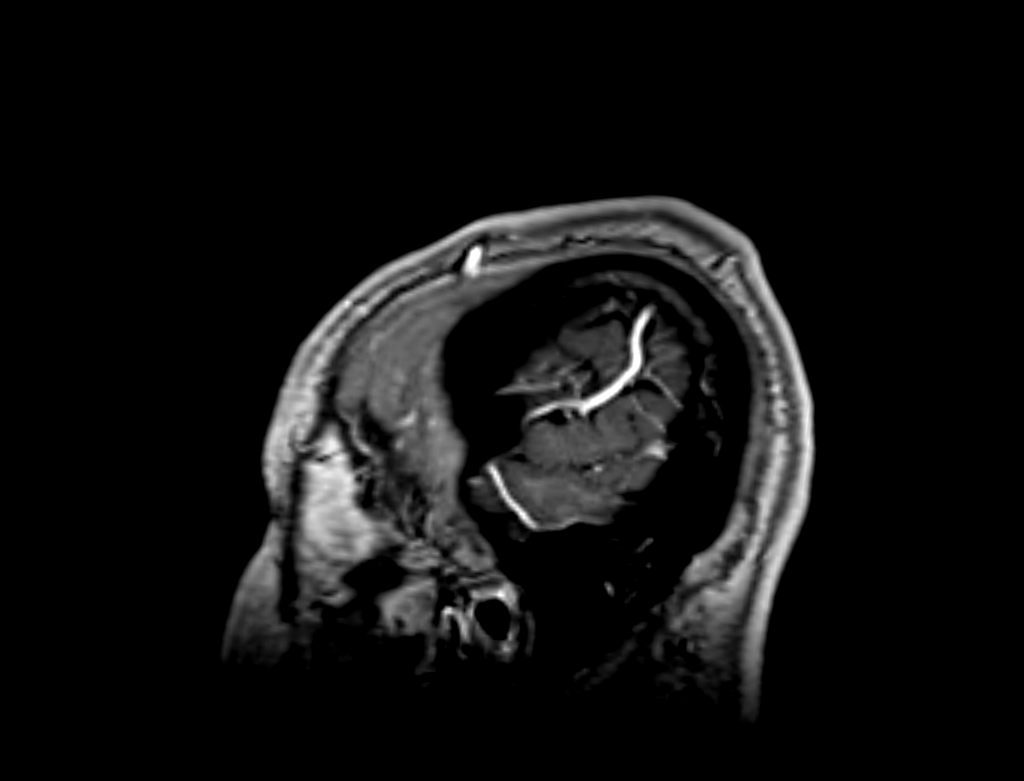

[Series 1069: mpr cor 1mm · coronal · 1.0mm · 0.22mm/px · 2 of 205 slices shown]
[im 21/205]
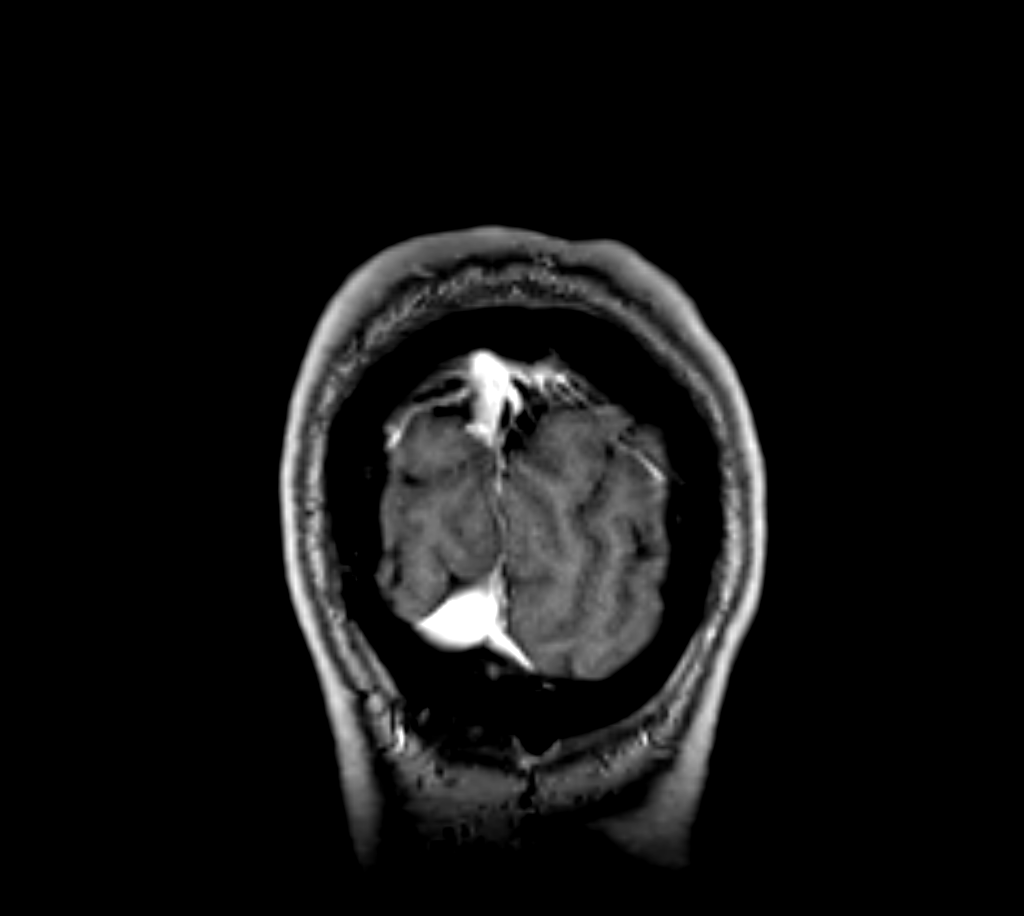
[im 103/205]
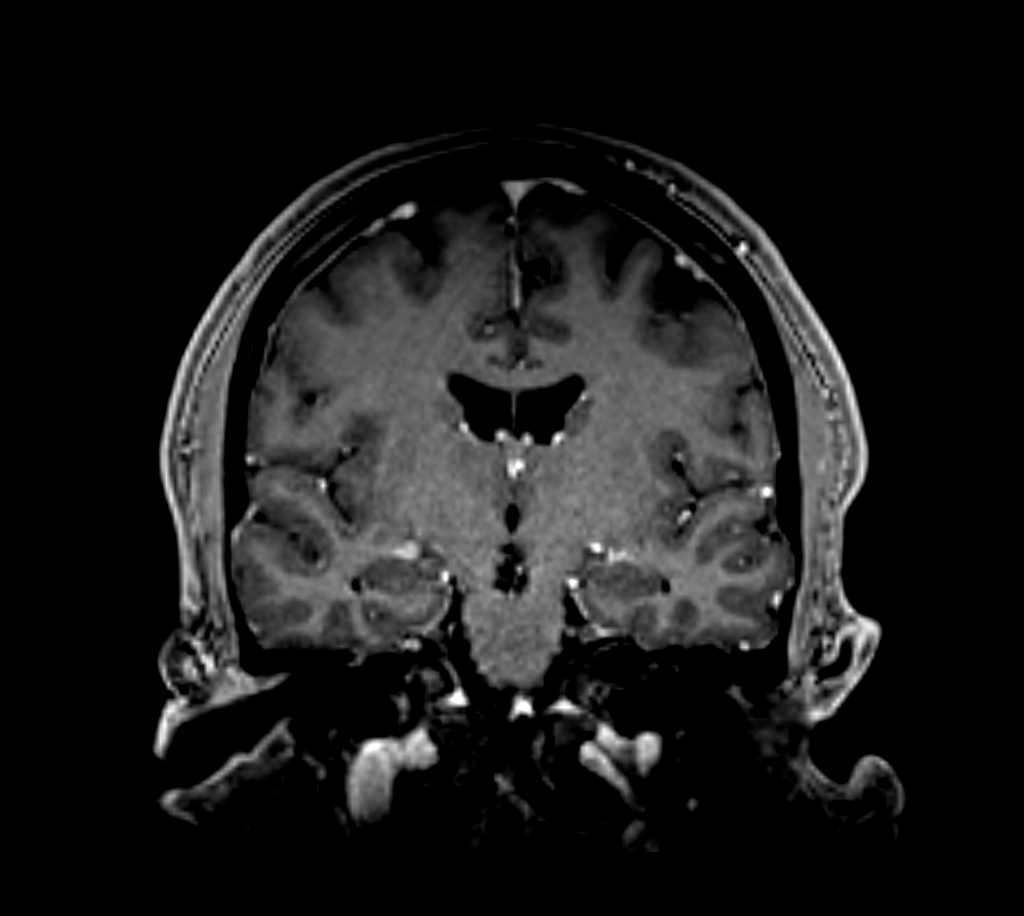

[11 of 48 positions shown; findings below may reference images not displayed]

FINDINGS: MRI HEAD WITHOUT AND WITH CONTRAST

Brain:

Cerebral volume is normal.

3 mm focus of T2 FLAIR hyperintense signal abnormality within the
left frontal lobe white matter (series 15, image 34).

No cortical encephalomalacia is identified.

There is no acute infarct.

No evidence of an intracranial mass.

No chronic intracranial blood products.

No extra-axial fluid collection.

No midline shift.

No pathologic intracranial enhancement identified.

Vascular: Maintained flow voids within the proximal large arterial
vessels.

Skull and upper cervical spine: No focal suspicious marrow lesion.

Sinuses/Orbits: Visualized orbits show no acute finding. Minimal
mucosal thickening within the bilateral frontal, ethmoid and
maxillary sinuses.

MR VENOGRAM HEAD WITHOUT AND WITH CONTRAST

The superior sagittal sinus, internal cerebral veins, vein of OMPONYE,
straight sinus, transverse sinuses, sigmoid sinuses and visualized
jugular veins are patent. No evidence of intracranial venous
thrombosis. The right transverse and sigmoid dural venous sinuses
are dominant.
IMPRESSION: MRI brain:

1. No evidence of acute intracranial abnormality.
2. 3 mm focus of T2 FLAIR hyperintense signal abnormality within the
left frontal lobe white matter, nonspecific and of doubtful clinical
significance as an isolated finding.
3. Otherwise unremarkable MRI appearance of the brain.
4. Mild paranasal sinus mucosal thickening.

MRV head:

No evidence of intracranial venous thrombosis.

## 2021-05-15 IMAGING — US US CAROTID DUPLEX BILAT
1 series · 13 of 24 positions shown · non-contrast
Comparison: None.

CLINICAL DATA: Dizziness, blurred vision

EXAM:
BILATERAL CAROTID DUPLEX ULTRASOUND
TECHNIQUE: Gray scale imaging, color Doppler and duplex ultrasound were
performed of bilateral carotid and vertebral arteries in the neck.

[Series 1: us carotid bilateral · 13 of 62 slices shown]
[im 1/62]
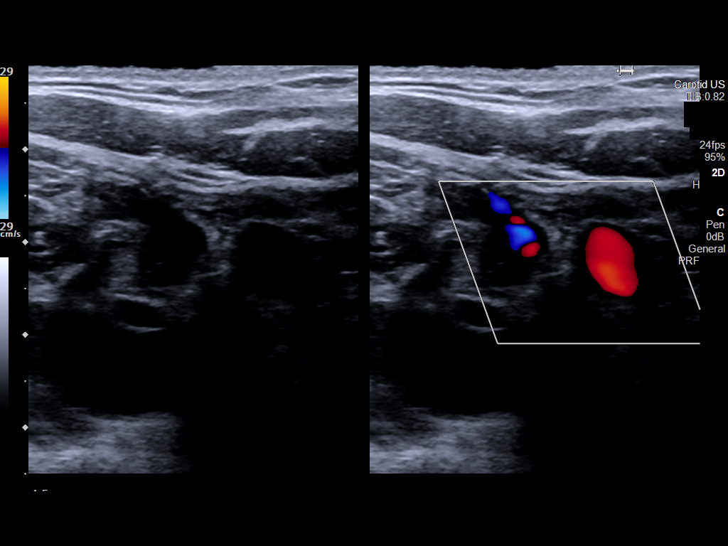
[im 6/62]
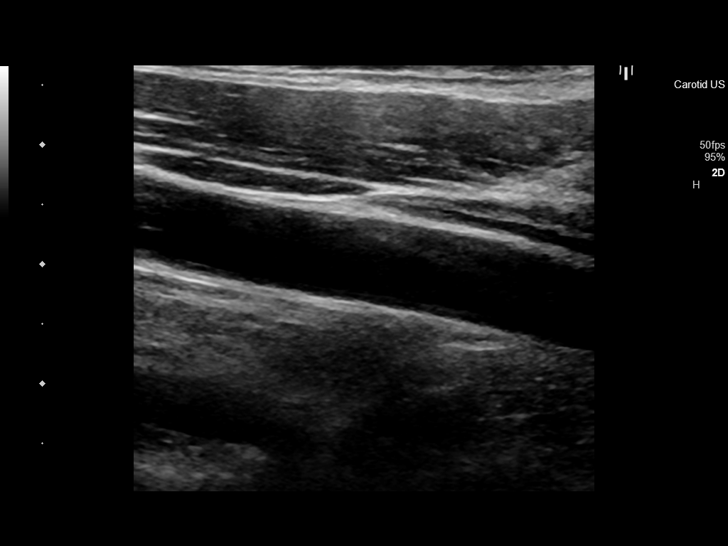
[im 11/62]
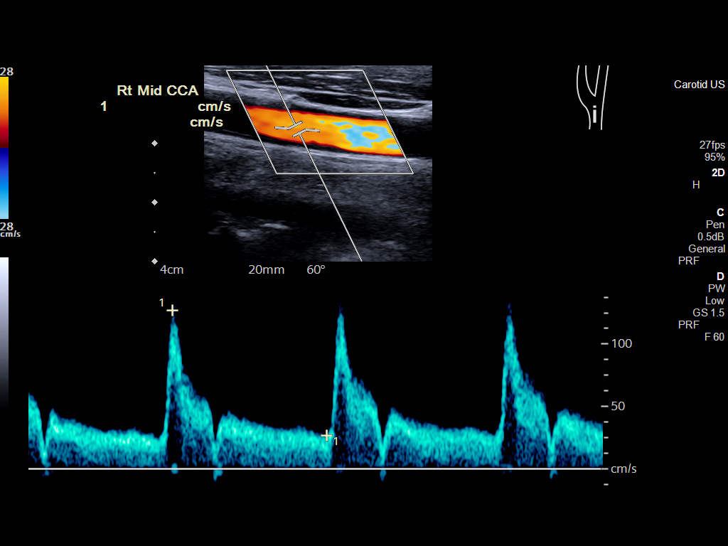
[im 16/62]
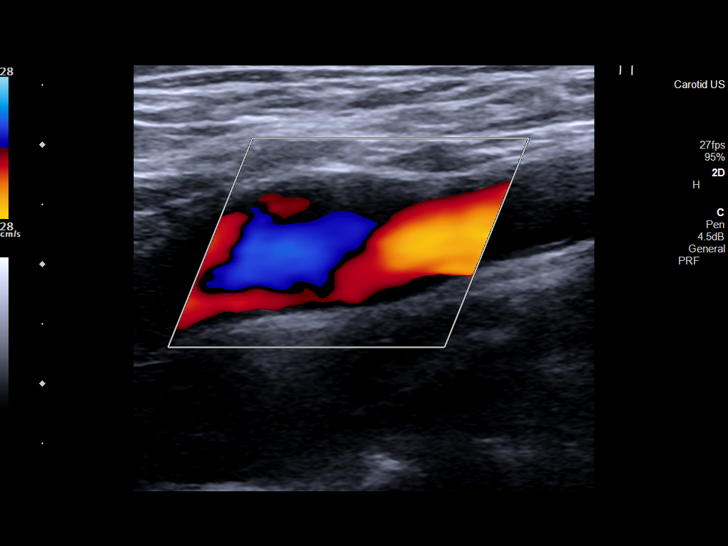
[im 22/62]
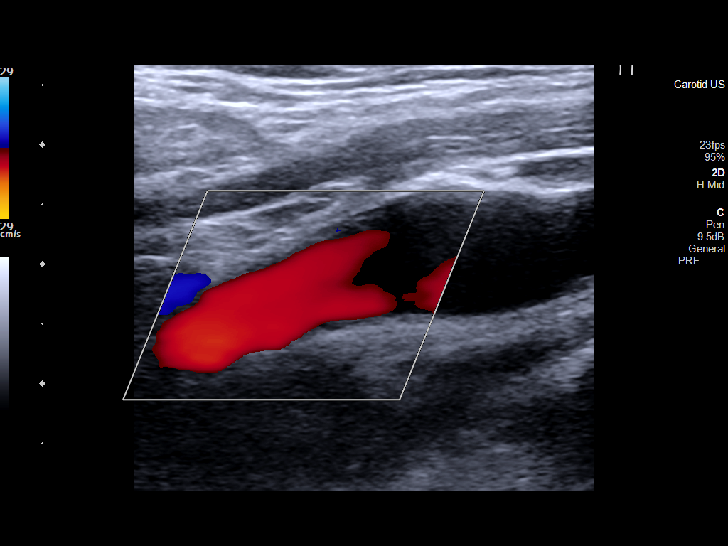
[im 27/62]
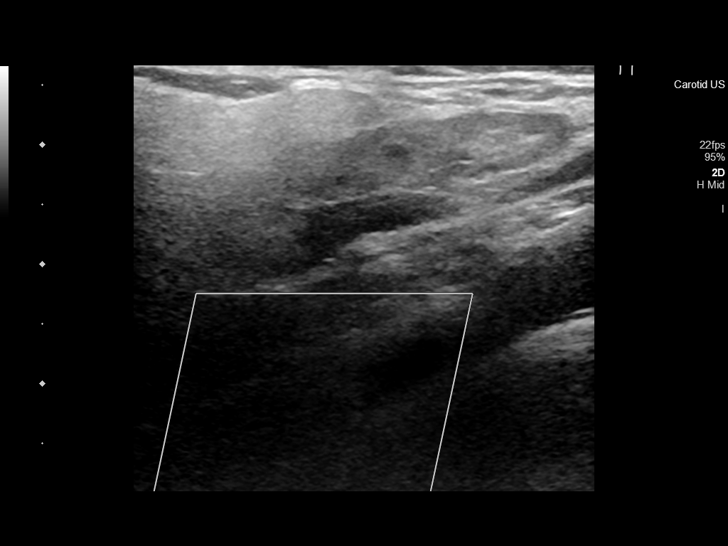
[im 32/62]
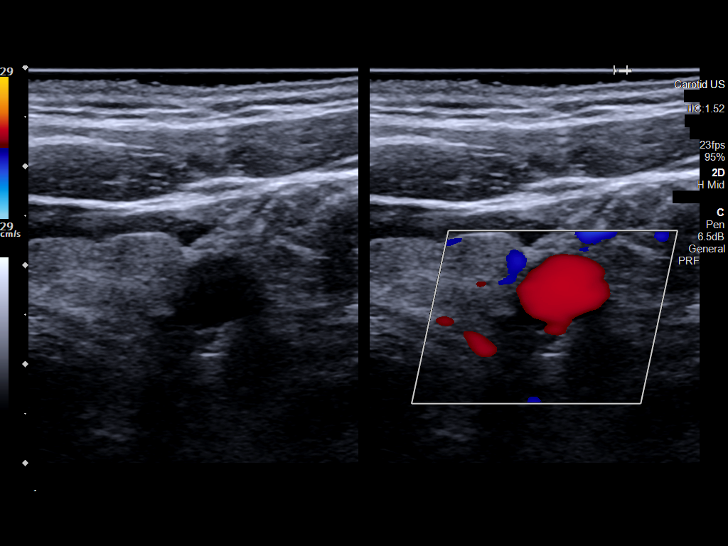
[im 35/62]
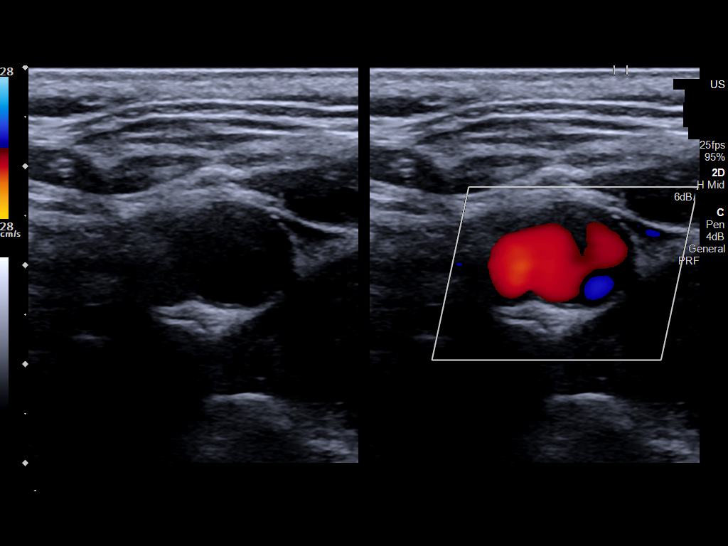
[im 40/62]
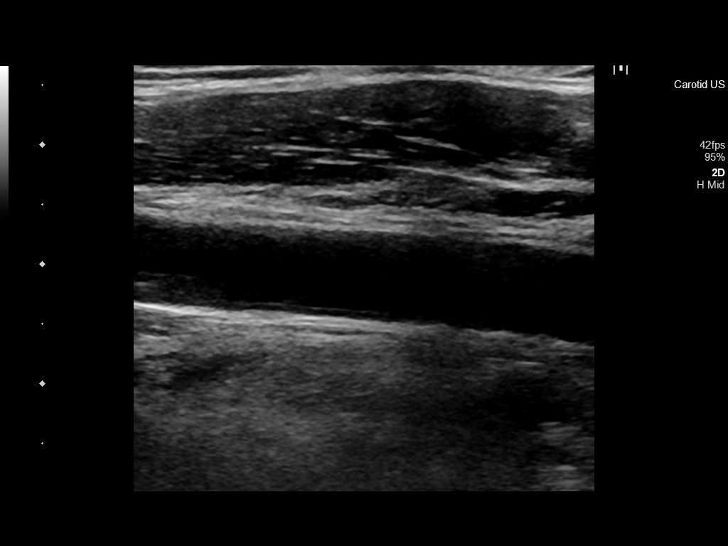
[im 46/62]
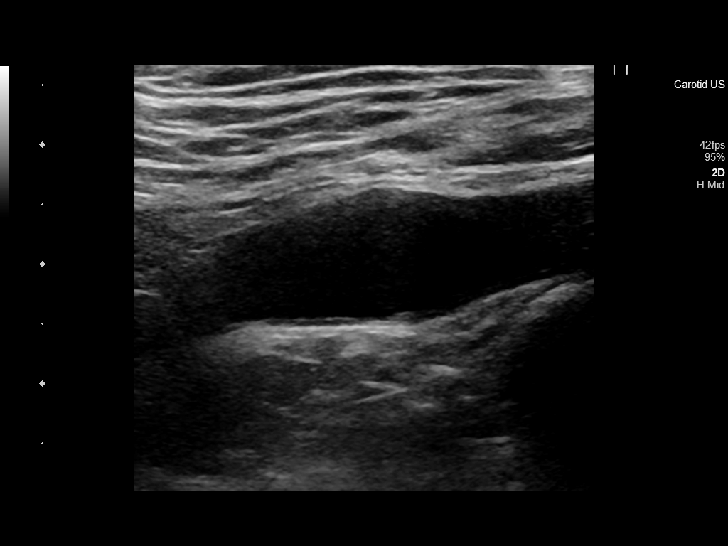
[im 51/62]
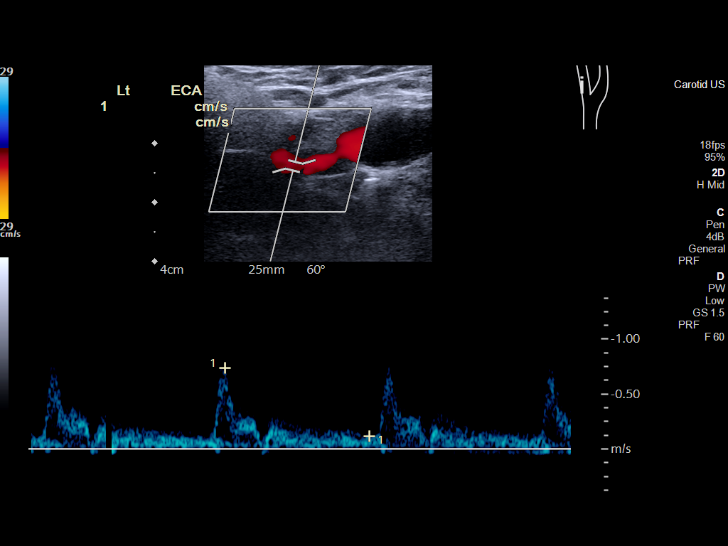
[im 56/62]
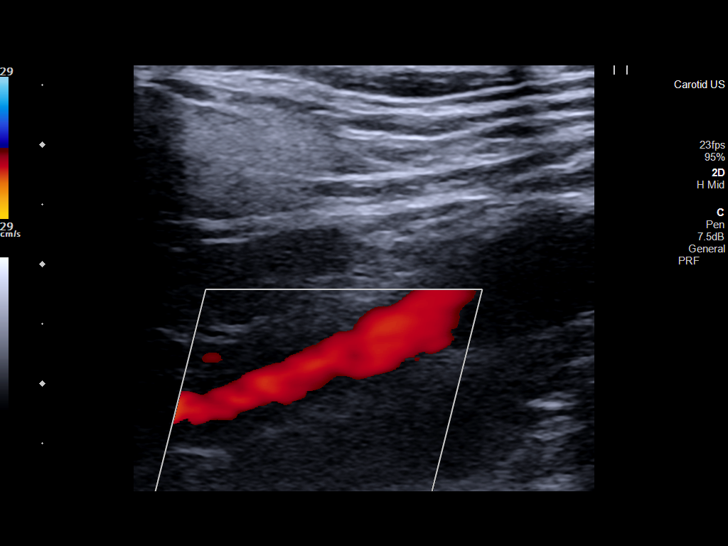
[im 62/62]
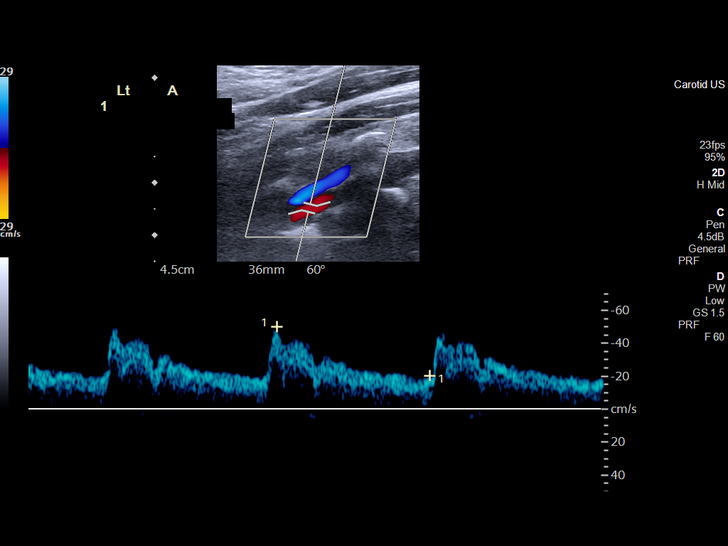

[13 of 24 positions shown; findings below may reference images not displayed]

FINDINGS: Criteria: Quantification of carotid stenosis is based on velocity
parameters that correlate the residual internal carotid diameter
with NASCET-based stenosis levels, using the diameter of the distal
internal carotid lumen as the denominator for stenosis measurement.

The following velocity measurements were obtained:

RIGHT
ICA: 72/31 cm/sec
CCA: 109/32 cm/sec

SYSTOLIC ICA/CCA RATIO:

ECA:  147 cm/sec

LEFT

ICA: 82/33 cm/sec

CCA: 76/28 cm/sec

SYSTOLIC ICA/CCA RATIO:

ECA:  74 cm/sec

RIGHT CAROTID ARTERY: No significant atherosclerotic plaque or
evidence of stenosis in the internal carotid artery.

RIGHT VERTEBRAL ARTERY:  Patent with normal antegrade flow.

LEFT CAROTID ARTERY: No significant atherosclerotic plaque or
evidence of stenosis in the internal carotid artery.

LEFT VERTEBRAL ARTERY:  Patent with normal antegrade flow.
IMPRESSION: 1. No evidence of significant atherosclerotic plaque or stenosis in
either internal carotid artery.
2. Vertebral arteries are patent with normal antegrade flow.

## 2021-05-15 MED ORDER — GADOBUTROL 1 MMOL/ML IV SOLN
10.0000 mL | Freq: Once | INTRAVENOUS | Status: AC | PRN
  Administered 2021-05-15: 10 mL via INTRAVENOUS

## 2021-05-16 ENCOUNTER — Ambulatory Visit
Admission: RE | Admit: 2021-05-16 | Discharge: 2021-05-16 | Disposition: A | Discharge status: Home / Self Care | Payer: No Typology Code available for payment source | Source: Ambulatory Visit | Attending: Physician Assistant | Admitting: Physician Assistant

## 2021-05-16 DIAGNOSIS — M5010 Cervical disc disorder with radiculopathy, unspecified cervical region: Secondary | ICD-10-CM | POA: Diagnosis present

## 2021-05-16 IMAGING — MR MR CERVICAL SPINE W/O CM
5 series · 40 of 48 positions shown · non-contrast
Comparison: None.

CLINICAL DATA: Neck pain worse on the right than the left. Pain in
the right shoulder and pectoralis region.

EXAM:
MRI CERVICAL SPINE WITHOUT CONTRAST
TECHNIQUE: Multiplanar, multisequence MR imaging of the cervical spine was
performed. No intravenous contrast was administered.

[Series 5: T2 · sagittal · 3.0mm · 0.62mm/px · 6 of 15 slices shown (1 of 2)]
[im 1/15]
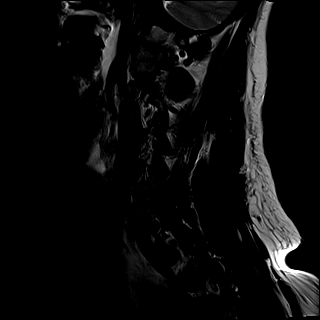
[im 3/15]
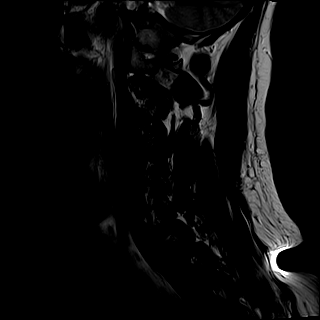
[im 6/15]
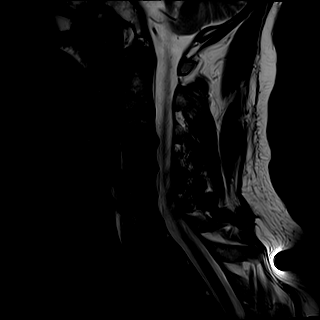
[im 9/15]
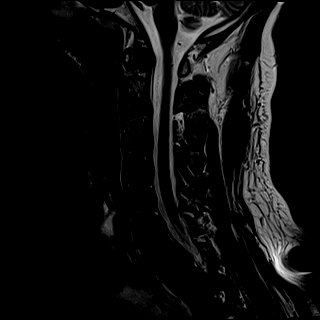
[im 12/15]
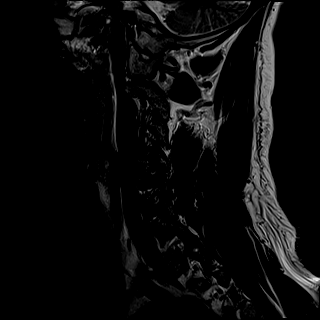
[im 15/15]
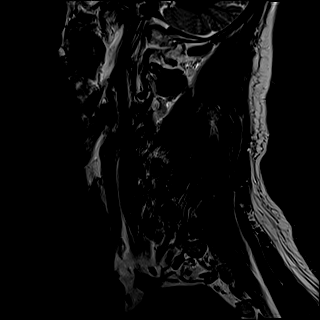

[Series 6: FLAIR · sagittal · 3.0mm · 0.78mm/px · 6 of 15 slices shown]
[im 1/15]
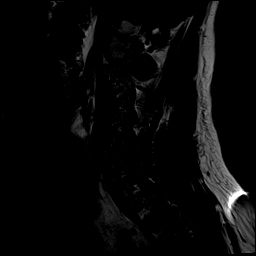
[im 3/15]
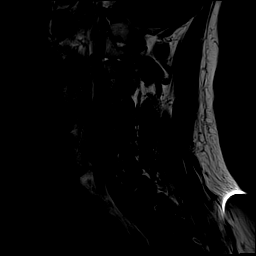
[im 6/15]
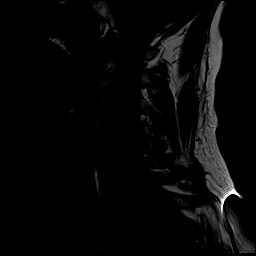
[im 9/15]
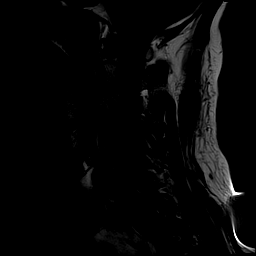
[im 12/15]
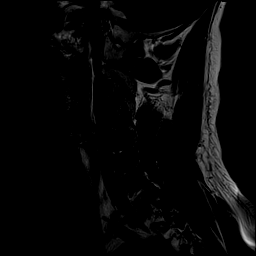
[im 15/15]
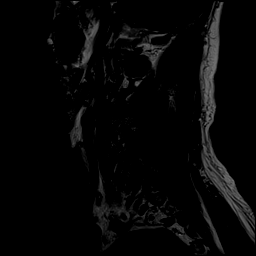

[Series 7: STIR · sagittal · 3.0mm · 0.62mm/px · 6 of 15 slices shown]
[im 1/15]
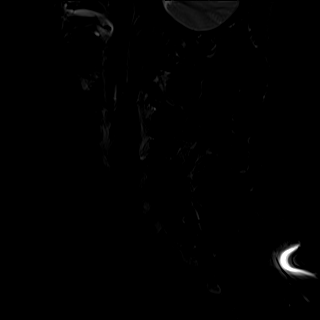
[im 3/15]
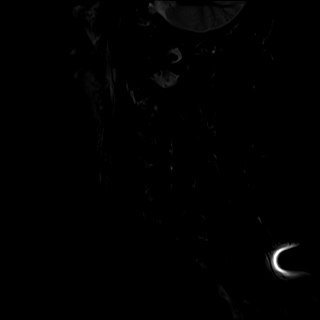
[im 6/15]
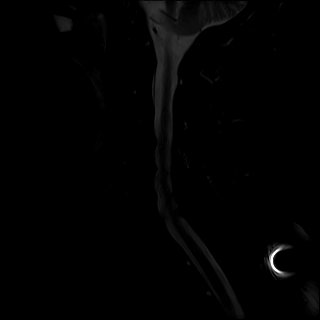
[im 9/15]
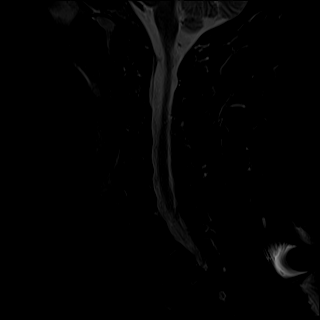
[im 12/15]
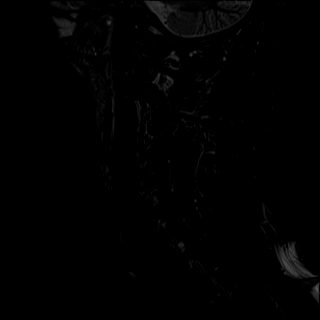
[im 15/15]
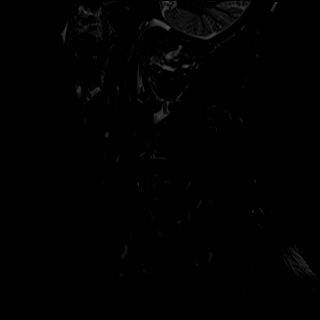

[Series 8: T2 · axial · 3.0mm · 0.70mm/px · z∈[-107,+11]mm · 14 of 35 slices shown (2 of 2)]
[im 1/35]
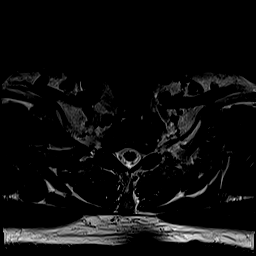
[im 3/35]
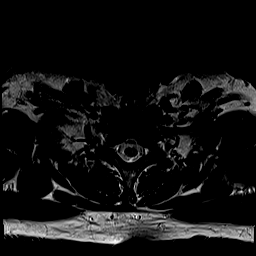
[im 5/35]
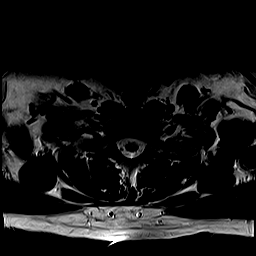
[im 8/35]
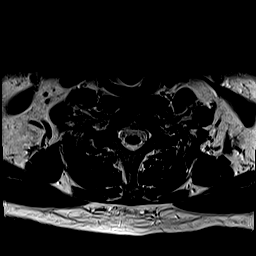
[im 10/35]
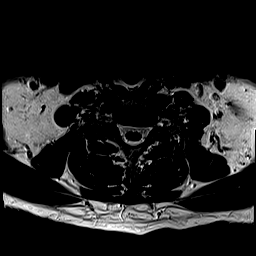
[im 13/35]
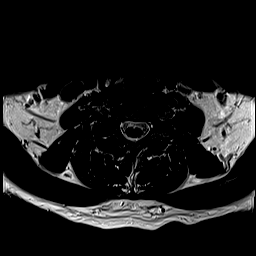
[im 15/35]
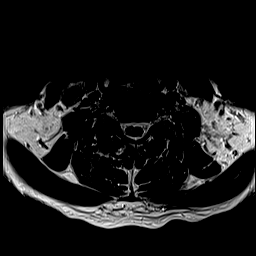
[im 18/35]
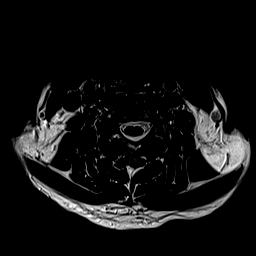
[im 20/35]
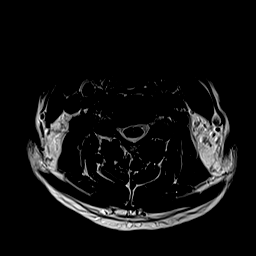
[im 22/35]
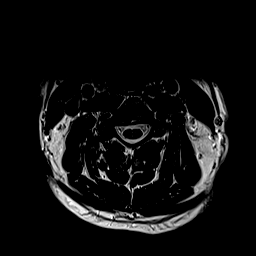
[im 25/35]
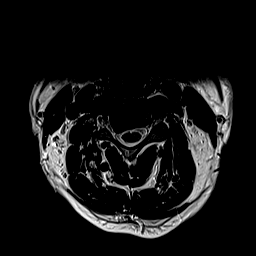
[im 27/35]
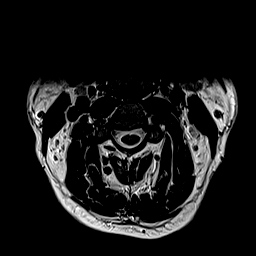
[im 30/35]
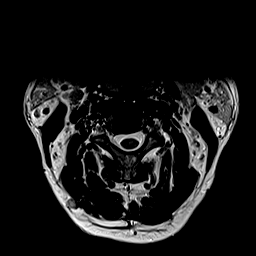
[im 35/35]
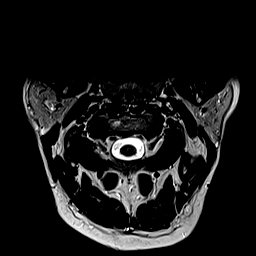

[Series 9: ax mpgr · axial · 3.0mm · 0.35mm/px · z∈[-107,+11]mm · 8 of 35 slices shown]
[im 1/35]
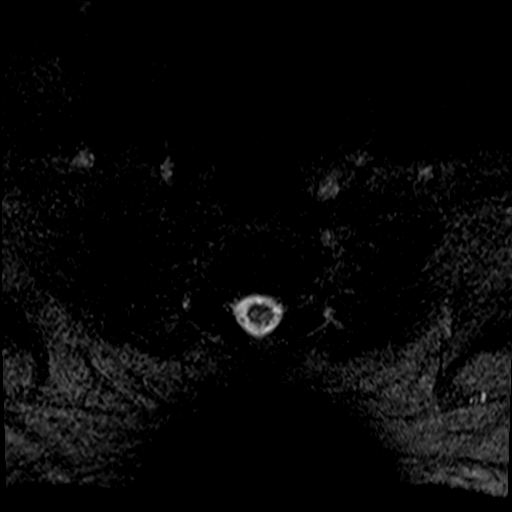
[im 5/35]
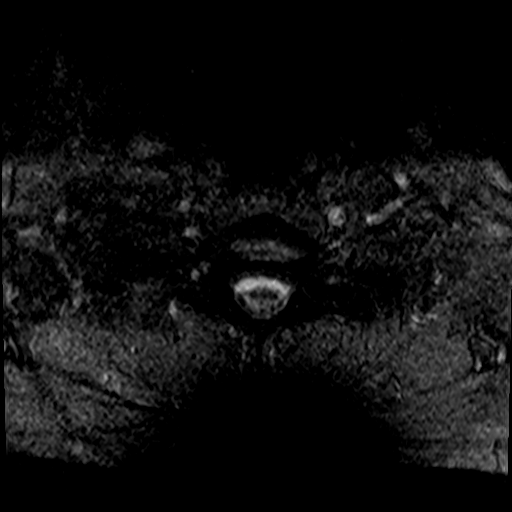
[im 10/35]
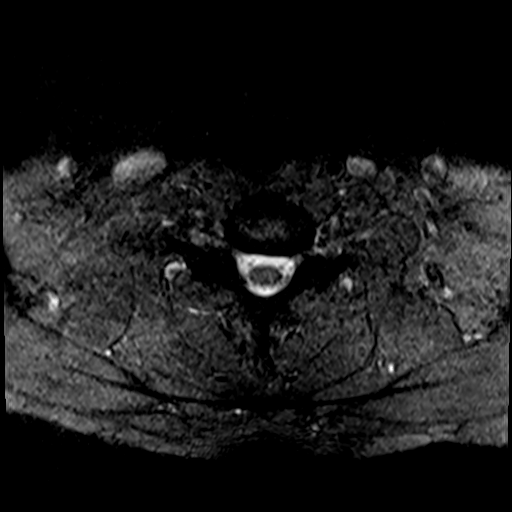
[im 15/35]
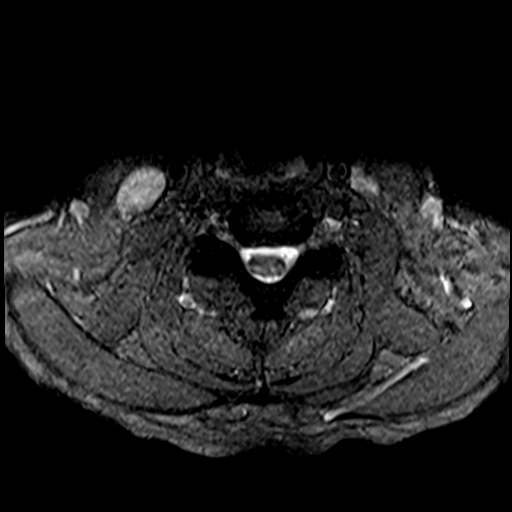
[im 20/35]
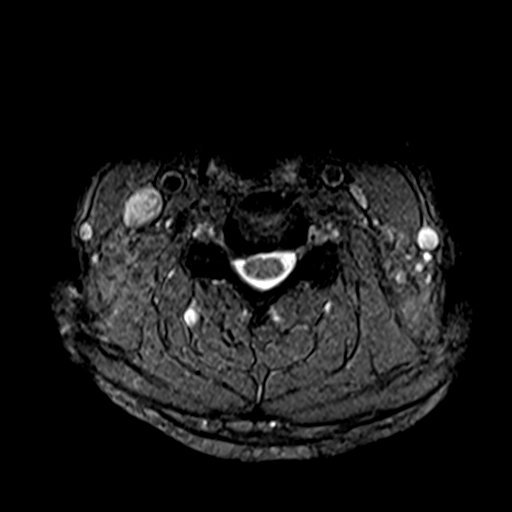
[im 25/35]
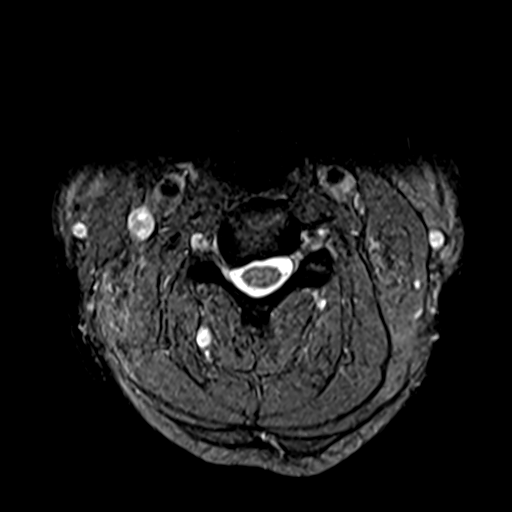
[im 30/35]
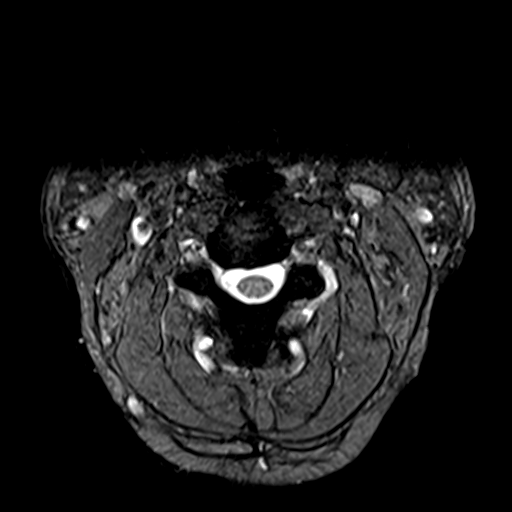
[im 35/35]
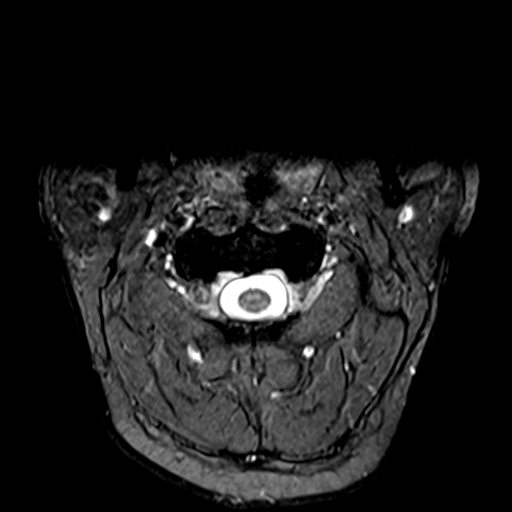

[40 of 48 positions shown; findings below may reference images not displayed]

FINDINGS: Alignment: Normal

Vertebrae: No fracture or focal bone lesion.

Cord: No cord compression or focal cord lesion.

Posterior Fossa, vertebral arteries, paraspinal tissues: Negative

Disc levels:

Foramen magnum widely patent.  C1-2 and C2-3 are normal.

C3-4: Moderate right-sided uncovertebral prominence. Moderate right
foraminal narrowing, not definitely compressive. No facet
arthropathy.

C4-5: Moderate right-sided uncovertebral prominence. Moderate to
marked right foraminal narrowing that could possibly affect the
right C5 nerve.

C5-6: Mild right-sided uncovertebral prominence. Moderate right
foraminal narrowing that could possibly affect the right C6 nerve.

C6-7: Mild bulging of the disc and uncovertebral prominence. Mild
bilateral foraminal narrowing, not visibly compressive.

C7-T1: Normal interspace.
IMPRESSION: Right-sided predominant spondylosis from C3-4 through C5-6. Right
foraminal narrowing at those levels that could possibly be
significant. This is most pronounced at C4-5 followed by C5-6. Mild
bilateral foraminal narrowing at the C6-7 level.

## 2021-06-21 IMAGING — CR DG ORBITS FOR FOREIGN BODY
2 series · 2 of 2 positions shown · non-contrast
Comparison: Head CT [DATE] reviewed

CLINICAL DATA: clearance prior to MRI

EXAM:
ORBITS FOR FOREIGN BODY - 2 VIEW

[orbits waters (1 of 2)]
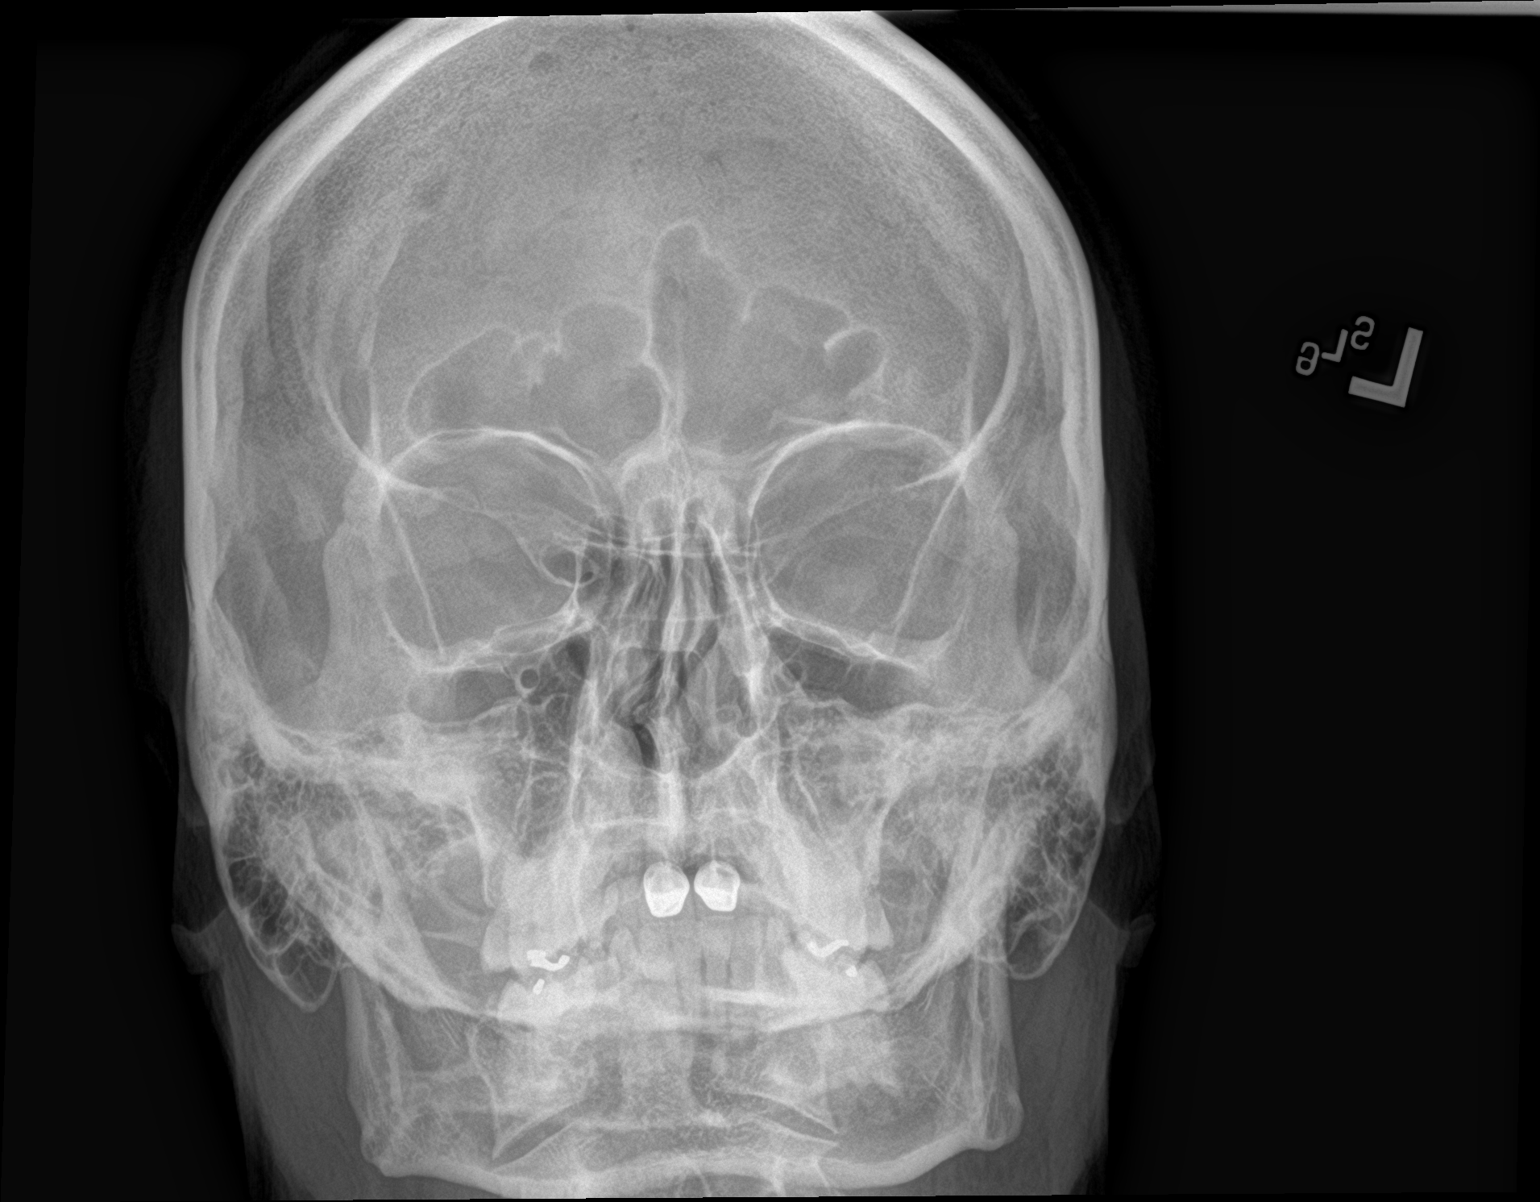

[orbits waters (2 of 2)]
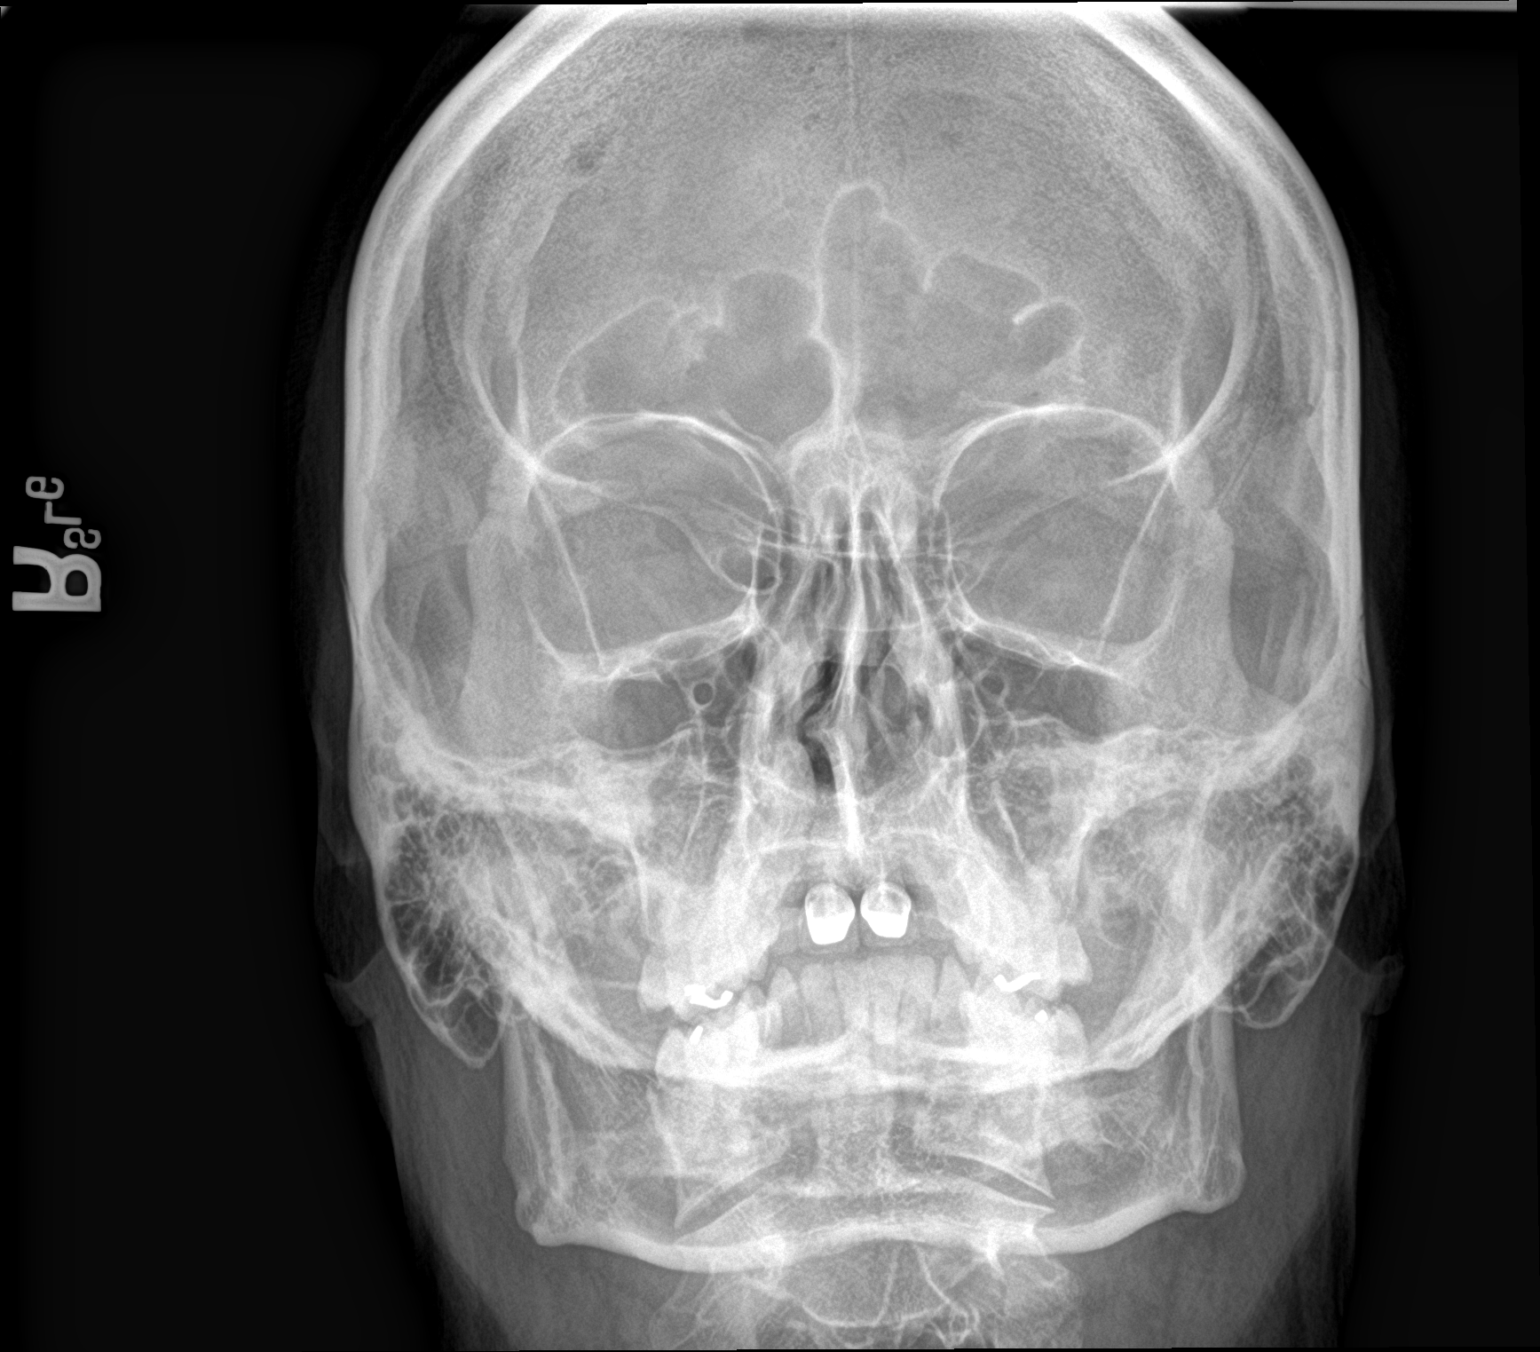

[2 of 2 positions shown; findings below may reference images not displayed]

FINDINGS: There is no evidence of metallic foreign body within the orbits. No
significant bone abnormality identified.
IMPRESSION: No evidence of metallic foreign body within the orbits.
